# Patient Record
Sex: Female | Born: 1940 | Race: White | Hispanic: No | Marital: Married | Smoking: Current every day smoker
Health system: Southern US, Community
[De-identification: ages and names within clinical notes are randomized; demographics above are authoritative.]

## PROBLEM LIST (undated history)

## (undated) ENCOUNTER — Inpatient Hospital Stay: Admission: EM | Payer: Self-pay | Source: Home / Self Care

## (undated) DIAGNOSIS — N200 Calculus of kidney: Secondary | ICD-10-CM

## (undated) DIAGNOSIS — F32A Depression, unspecified: Secondary | ICD-10-CM

## (undated) DIAGNOSIS — R7303 Prediabetes: Secondary | ICD-10-CM

## (undated) DIAGNOSIS — F419 Anxiety disorder, unspecified: Secondary | ICD-10-CM

## (undated) DIAGNOSIS — M1990 Unspecified osteoarthritis, unspecified site: Secondary | ICD-10-CM

## (undated) DIAGNOSIS — R739 Hyperglycemia, unspecified: Secondary | ICD-10-CM

## (undated) DIAGNOSIS — Z720 Tobacco use: Secondary | ICD-10-CM

## (undated) DIAGNOSIS — F329 Major depressive disorder, single episode, unspecified: Secondary | ICD-10-CM

## (undated) DIAGNOSIS — R32 Unspecified urinary incontinence: Secondary | ICD-10-CM

## (undated) DIAGNOSIS — G479 Sleep disorder, unspecified: Secondary | ICD-10-CM

## (undated) DIAGNOSIS — I10 Essential (primary) hypertension: Secondary | ICD-10-CM

## (undated) DIAGNOSIS — R002 Palpitations: Secondary | ICD-10-CM

## (undated) DIAGNOSIS — L80 Vitiligo: Secondary | ICD-10-CM

## (undated) DIAGNOSIS — Z87442 Personal history of urinary calculi: Secondary | ICD-10-CM

## (undated) DIAGNOSIS — R011 Cardiac murmur, unspecified: Secondary | ICD-10-CM

## (undated) DIAGNOSIS — R82998 Other abnormal findings in urine: Secondary | ICD-10-CM

## (undated) DIAGNOSIS — M8580 Other specified disorders of bone density and structure, unspecified site: Secondary | ICD-10-CM

## (undated) DIAGNOSIS — E785 Hyperlipidemia, unspecified: Secondary | ICD-10-CM

## (undated) HISTORY — PX: TUBAL LIGATION: SHX77

## (undated) HISTORY — DX: Sleep disorder, unspecified: G47.9

## (undated) HISTORY — PX: OOPHORECTOMY: SHX86

## (undated) HISTORY — DX: Unspecified osteoarthritis, unspecified site: M19.90

## (undated) HISTORY — PX: CARPAL TUNNEL RELEASE: SHX101

## (undated) HISTORY — PX: BREAST BIOPSY: SHX20

## (undated) HISTORY — DX: Other abnormal findings in urine: R82.998

## (undated) HISTORY — DX: Hyperglycemia, unspecified: R73.9

## (undated) HISTORY — DX: Cardiac murmur, unspecified: R01.1

## (undated) HISTORY — DX: Depression, unspecified: F32.A

## (undated) HISTORY — DX: Other specified disorders of bone density and structure, unspecified site: M85.80

## (undated) HISTORY — DX: Tobacco use: Z72.0

## (undated) HISTORY — DX: Vitiligo: L80

## (undated) HISTORY — DX: Calculus of kidney: N20.0

## (undated) HISTORY — DX: Hyperlipidemia, unspecified: E78.5

## (undated) HISTORY — DX: Major depressive disorder, single episode, unspecified: F32.9

## (undated) HISTORY — PX: HEMORRHOID SURGERY: SHX153

---

## 1999-05-30 DIAGNOSIS — R002 Palpitations: Secondary | ICD-10-CM

## 1999-05-30 HISTORY — DX: Palpitations: R00.2

## 1999-05-31 ENCOUNTER — Other Ambulatory Visit: Admission: RE | Admit: 1999-05-31 | Discharge: 1999-05-31 | Payer: Self-pay | Admitting: Family Medicine

## 2001-01-09 ENCOUNTER — Other Ambulatory Visit: Admission: RE | Admit: 2001-01-09 | Discharge: 2001-01-09 | Payer: Self-pay | Admitting: Family Medicine

## 2001-05-29 HISTORY — PX: TRIGGER FINGER RELEASE: SHX641

## 2002-04-29 ENCOUNTER — Other Ambulatory Visit: Admission: RE | Admit: 2002-04-29 | Discharge: 2002-04-29 | Payer: Self-pay | Admitting: Family Medicine

## 2003-05-05 ENCOUNTER — Other Ambulatory Visit: Admission: RE | Admit: 2003-05-05 | Discharge: 2003-05-05 | Payer: Self-pay | Admitting: Family Medicine

## 2003-05-30 DIAGNOSIS — R82998 Other abnormal findings in urine: Secondary | ICD-10-CM

## 2003-05-30 HISTORY — DX: Other abnormal findings in urine: R82.998

## 2003-08-28 LAB — HM DEXA SCAN

## 2004-04-29 ENCOUNTER — Ambulatory Visit: Payer: Self-pay | Admitting: Family Medicine

## 2004-05-19 ENCOUNTER — Ambulatory Visit: Payer: Self-pay | Admitting: Family Medicine

## 2004-10-17 ENCOUNTER — Ambulatory Visit: Payer: Self-pay | Admitting: Family Medicine

## 2004-10-17 ENCOUNTER — Other Ambulatory Visit: Admission: RE | Admit: 2004-10-17 | Discharge: 2004-10-17 | Payer: Self-pay | Admitting: Family Medicine

## 2004-10-17 LAB — CONVERTED CEMR LAB: Pap Smear: NORMAL

## 2004-10-18 ENCOUNTER — Ambulatory Visit: Payer: Self-pay | Admitting: Family Medicine

## 2004-10-26 ENCOUNTER — Ambulatory Visit: Payer: Self-pay | Admitting: Family Medicine

## 2004-11-07 ENCOUNTER — Ambulatory Visit: Payer: Self-pay | Admitting: Family Medicine

## 2005-01-18 ENCOUNTER — Ambulatory Visit: Payer: Self-pay | Admitting: Family Medicine

## 2005-01-25 ENCOUNTER — Ambulatory Visit: Payer: Self-pay | Admitting: Family Medicine

## 2005-02-07 ENCOUNTER — Ambulatory Visit: Payer: Self-pay | Admitting: Oncology

## 2005-04-07 ENCOUNTER — Ambulatory Visit: Payer: Self-pay | Admitting: Family Medicine

## 2005-05-10 ENCOUNTER — Ambulatory Visit: Payer: Self-pay | Admitting: Family Medicine

## 2005-08-09 ENCOUNTER — Ambulatory Visit: Payer: Self-pay | Admitting: Family Medicine

## 2006-05-02 ENCOUNTER — Ambulatory Visit: Payer: Self-pay | Admitting: Family Medicine

## 2006-06-25 ENCOUNTER — Ambulatory Visit: Payer: Self-pay | Admitting: Family Medicine

## 2006-06-25 LAB — CONVERTED CEMR LAB
AST: 23 units/L (ref 0–37)
BUN: 11 mg/dL (ref 6–23)
CO2: 27 meq/L (ref 19–32)
Calcium: 8.8 mg/dL (ref 8.4–10.5)
Chloride: 102 meq/L (ref 96–112)
Cholesterol: 192 mg/dL (ref 0–200)
Creatinine, Ser: 0.7 mg/dL (ref 0.4–1.2)
Eosinophils Relative: 2.2 % (ref 0.0–5.0)
Glucose, Bld: 146 mg/dL — ABNORMAL HIGH (ref 70–99)
LDL Cholesterol: 107 mg/dL — ABNORMAL HIGH (ref 0–99)
MCHC: 33.9 g/dL (ref 30.0–36.0)
Monocytes Relative: 7.2 % (ref 3.0–11.0)
Neutrophils Relative %: 53.3 % (ref 43.0–77.0)
RBC: 4.55 M/uL (ref 3.87–5.11)
RDW: 13.3 % (ref 11.5–14.6)
Sodium: 138 meq/L (ref 135–145)
Total CHOL/HDL Ratio: 3.6
Triglycerides: 156 mg/dL — ABNORMAL HIGH (ref 0–149)
VLDL: 31 mg/dL (ref 0–40)

## 2006-07-12 ENCOUNTER — Ambulatory Visit: Payer: Self-pay | Admitting: Family Medicine

## 2006-08-07 ENCOUNTER — Ambulatory Visit: Payer: Self-pay | Admitting: Family Medicine

## 2006-08-07 LAB — CONVERTED CEMR LAB: Glucose, Bld: 110 mg/dL — ABNORMAL HIGH (ref 70–99)

## 2006-09-17 ENCOUNTER — Ambulatory Visit: Payer: Self-pay | Admitting: Family Medicine

## 2006-09-17 LAB — CONVERTED CEMR LAB
Hgb A1c MFr Bld: 5.8 %
Hgb A1c MFr Bld: 5.8 % (ref 4.6–6.0)

## 2006-11-19 ENCOUNTER — Telehealth (INDEPENDENT_AMBULATORY_CARE_PROVIDER_SITE_OTHER): Payer: Self-pay | Admitting: *Deleted

## 2006-11-27 LAB — HM COLONOSCOPY

## 2006-12-05 ENCOUNTER — Ambulatory Visit: Payer: Self-pay | Admitting: Unknown Physician Specialty

## 2007-01-14 ENCOUNTER — Ambulatory Visit: Payer: Self-pay | Admitting: Family Medicine

## 2007-01-14 DIAGNOSIS — Z789 Other specified health status: Secondary | ICD-10-CM

## 2007-01-14 DIAGNOSIS — N6019 Diffuse cystic mastopathy of unspecified breast: Secondary | ICD-10-CM

## 2007-01-14 DIAGNOSIS — R011 Cardiac murmur, unspecified: Secondary | ICD-10-CM | POA: Insufficient documentation

## 2007-01-14 DIAGNOSIS — L80 Vitiligo: Secondary | ICD-10-CM | POA: Insufficient documentation

## 2007-01-14 DIAGNOSIS — G4700 Insomnia, unspecified: Secondary | ICD-10-CM | POA: Insufficient documentation

## 2007-01-14 DIAGNOSIS — M8580 Other specified disorders of bone density and structure, unspecified site: Secondary | ICD-10-CM

## 2007-01-14 DIAGNOSIS — M1990 Unspecified osteoarthritis, unspecified site: Secondary | ICD-10-CM | POA: Insufficient documentation

## 2007-01-14 DIAGNOSIS — G479 Sleep disorder, unspecified: Secondary | ICD-10-CM | POA: Insufficient documentation

## 2007-01-14 LAB — CONVERTED CEMR LAB
Casts: 0 /lpf
Glucose, Urine, Semiquant: NEGATIVE
Specific Gravity, Urine: <1.005
Urine crystals, microscopic: 0 /hpf
Urobilinogen, UA: 0.2
Yeast, UA: 0
pH: 7

## 2007-01-15 ENCOUNTER — Encounter: Payer: Self-pay | Admitting: Family Medicine

## 2007-01-22 ENCOUNTER — Encounter: Payer: Self-pay | Admitting: Family Medicine

## 2007-01-22 LAB — CONVERTED CEMR LAB
Bilirubin Urine: NEGATIVE
Glucose, Urine, Semiquant: NEGATIVE
Nitrite: NEGATIVE
Protein, U semiquant: NEGATIVE
Urine crystals, microscopic: 0 /hpf
Yeast, UA: 0
pH: 6.5

## 2007-01-23 ENCOUNTER — Encounter: Payer: Self-pay | Admitting: Family Medicine

## 2007-02-20 ENCOUNTER — Telehealth: Payer: Self-pay | Admitting: Family Medicine

## 2007-02-20 ENCOUNTER — Ambulatory Visit: Payer: Self-pay | Admitting: Family Medicine

## 2007-02-20 LAB — CONVERTED CEMR LAB
Bilirubin Urine: NEGATIVE
Ketones, urine, test strip: NEGATIVE
Specific Gravity, Urine: <1.005
Urobilinogen, UA: 0.2
Yeast, UA: 0

## 2007-02-26 ENCOUNTER — Ambulatory Visit: Payer: Self-pay | Admitting: Cardiology

## 2007-02-26 DIAGNOSIS — N209 Urinary calculus, unspecified: Secondary | ICD-10-CM | POA: Insufficient documentation

## 2007-02-27 ENCOUNTER — Ambulatory Visit (HOSPITAL_BASED_OUTPATIENT_CLINIC_OR_DEPARTMENT_OTHER): Admission: RE | Admit: 2007-02-27 | Discharge: 2007-02-27 | Payer: Self-pay | Admitting: Urology

## 2007-02-27 ENCOUNTER — Encounter: Payer: Self-pay | Admitting: Family Medicine

## 2007-04-10 ENCOUNTER — Telehealth: Payer: Self-pay | Admitting: Family Medicine

## 2007-07-12 ENCOUNTER — Encounter: Payer: Self-pay | Admitting: Family Medicine

## 2007-10-01 ENCOUNTER — Other Ambulatory Visit: Admission: RE | Admit: 2007-10-01 | Discharge: 2007-10-01 | Payer: Self-pay | Admitting: Family Medicine

## 2007-10-01 ENCOUNTER — Encounter: Payer: Self-pay | Admitting: Family Medicine

## 2007-10-01 ENCOUNTER — Ambulatory Visit: Payer: Self-pay | Admitting: Family Medicine

## 2007-10-01 DIAGNOSIS — R739 Hyperglycemia, unspecified: Secondary | ICD-10-CM | POA: Insufficient documentation

## 2007-10-01 DIAGNOSIS — K219 Gastro-esophageal reflux disease without esophagitis: Secondary | ICD-10-CM

## 2007-10-01 DIAGNOSIS — F329 Major depressive disorder, single episode, unspecified: Secondary | ICD-10-CM

## 2007-10-01 DIAGNOSIS — E785 Hyperlipidemia, unspecified: Secondary | ICD-10-CM | POA: Insufficient documentation

## 2007-10-01 LAB — CONVERTED CEMR LAB
Bacteria, UA: 0
Casts: 0 /lpf
Glucose, Urine, Semiquant: NEGATIVE
Mucus, UA: 0
Urine crystals, microscopic: 0 /hpf
Urobilinogen, UA: 0.2
WBC, UA: 1 cells/hpf
pH: 6.5

## 2007-10-04 ENCOUNTER — Encounter (INDEPENDENT_AMBULATORY_CARE_PROVIDER_SITE_OTHER): Payer: Self-pay | Admitting: *Deleted

## 2007-10-07 ENCOUNTER — Encounter (INDEPENDENT_AMBULATORY_CARE_PROVIDER_SITE_OTHER): Payer: Self-pay | Admitting: *Deleted

## 2007-10-07 LAB — CONVERTED CEMR LAB
ALT: 17 units/L (ref 0–35)
Alkaline Phosphatase: 60 units/L (ref 39–117)
BUN: 12 mg/dL (ref 6–23)
Basophils Relative: 0.4 % (ref 0.0–1.0)
Calcium: 9.4 mg/dL (ref 8.4–10.5)
Chloride: 108 meq/L (ref 96–112)
Cholesterol: 199 mg/dL (ref 0–200)
Eosinophils Absolute: 0.1 10*3/uL (ref 0.0–0.7)
HCT: 42.8 % (ref 36.0–46.0)
HDL: 54.7 mg/dL (ref >39.0)
Hgb A1c MFr Bld: 5.9 % (ref 4.6–6.0)
MCV: 94.6 fL (ref 78.0–100.0)
Phosphorus: 3.6 mg/dL (ref 2.3–4.6)
Platelets: 276 10*3/uL (ref 150–400)
RDW: 13.3 % (ref 11.5–14.6)
Total Bilirubin: 0.9 mg/dL (ref 0.3–1.2)
Total Protein: 7 g/dL (ref 6.0–8.3)
Triglycerides: 129 mg/dL (ref 0–149)

## 2007-10-23 ENCOUNTER — Encounter: Payer: Self-pay | Admitting: Family Medicine

## 2007-10-23 ENCOUNTER — Ambulatory Visit: Payer: Self-pay | Admitting: Family Medicine

## 2007-10-25 ENCOUNTER — Encounter (INDEPENDENT_AMBULATORY_CARE_PROVIDER_SITE_OTHER): Payer: Self-pay | Admitting: *Deleted

## 2007-10-29 ENCOUNTER — Encounter: Payer: Self-pay | Admitting: Family Medicine

## 2007-11-12 ENCOUNTER — Ambulatory Visit: Payer: Self-pay | Admitting: Family Medicine

## 2007-11-12 DIAGNOSIS — E559 Vitamin D deficiency, unspecified: Secondary | ICD-10-CM

## 2007-12-25 ENCOUNTER — Ambulatory Visit: Payer: Self-pay | Admitting: Family Medicine

## 2007-12-27 ENCOUNTER — Encounter (INDEPENDENT_AMBULATORY_CARE_PROVIDER_SITE_OTHER): Payer: Self-pay | Admitting: *Deleted

## 2007-12-27 LAB — CONVERTED CEMR LAB: Vit D, 1,25-Dihydroxy: 35 (ref 30–89)

## 2007-12-31 ENCOUNTER — Telehealth: Payer: Self-pay | Admitting: Family Medicine

## 2008-01-08 ENCOUNTER — Telehealth: Payer: Self-pay | Admitting: Family Medicine

## 2008-01-10 ENCOUNTER — Telehealth (INDEPENDENT_AMBULATORY_CARE_PROVIDER_SITE_OTHER): Payer: Self-pay | Admitting: *Deleted

## 2008-03-06 ENCOUNTER — Ambulatory Visit: Payer: Self-pay | Admitting: Family Medicine

## 2008-04-09 ENCOUNTER — Telehealth: Payer: Self-pay | Admitting: Family Medicine

## 2008-05-11 ENCOUNTER — Telehealth: Payer: Self-pay | Admitting: Family Medicine

## 2008-06-29 HISTORY — PX: HAND SURGERY: SHX662

## 2008-09-04 ENCOUNTER — Ambulatory Visit (HOSPITAL_COMMUNITY): Admission: RE | Admit: 2008-09-04 | Discharge: 2008-09-04 | Payer: Self-pay | Admitting: Urology

## 2008-09-21 ENCOUNTER — Ambulatory Visit (HOSPITAL_COMMUNITY): Admission: RE | Admit: 2008-09-21 | Discharge: 2008-09-21 | Payer: Self-pay | Admitting: Urology

## 2008-10-23 ENCOUNTER — Telehealth: Payer: Self-pay | Admitting: Family Medicine

## 2009-01-05 ENCOUNTER — Telehealth: Payer: Self-pay | Admitting: Family Medicine

## 2009-01-11 ENCOUNTER — Encounter: Payer: Self-pay | Admitting: Family Medicine

## 2009-01-11 ENCOUNTER — Ambulatory Visit: Payer: Self-pay | Admitting: Family Medicine

## 2009-01-18 ENCOUNTER — Encounter (INDEPENDENT_AMBULATORY_CARE_PROVIDER_SITE_OTHER): Payer: Self-pay | Admitting: *Deleted

## 2009-02-09 ENCOUNTER — Encounter: Payer: Self-pay | Admitting: Family Medicine

## 2009-02-09 ENCOUNTER — Other Ambulatory Visit: Admission: RE | Admit: 2009-02-09 | Discharge: 2009-02-09 | Payer: Self-pay | Admitting: Family Medicine

## 2009-02-09 ENCOUNTER — Ambulatory Visit: Payer: Self-pay | Admitting: Family Medicine

## 2009-02-09 LAB — CONVERTED CEMR LAB
KOH Prep: NEGATIVE
Whiff Test: NEGATIVE

## 2009-02-10 LAB — CONVERTED CEMR LAB
ALT: 15 units/L (ref 0–35)
Basophils Relative: 0.8 % (ref 0.0–3.0)
Chloride: 108 meq/L (ref 96–112)
Cholesterol: 203 mg/dL — ABNORMAL HIGH (ref 0–200)
Creatinine, Ser: 0.6 mg/dL (ref 0.4–1.2)
Eosinophils Absolute: 0.1 10*3/uL (ref 0.0–0.7)
Glucose, Bld: 80 mg/dL (ref 70–99)
HCT: 42.8 % (ref 36.0–46.0)
HDL: 58.3 mg/dL (ref >39.00)
Hemoglobin: 14.5 g/dL (ref 12.0–15.0)
Lymphs Abs: 1.9 10*3/uL (ref 0.7–4.0)
MCV: 97.2 fL (ref 78.0–100.0)
Monocytes Absolute: 0.6 10*3/uL (ref 0.1–1.0)
Neutro Abs: 3.8 10*3/uL (ref 1.4–7.7)
Platelets: 246 10*3/uL (ref 150.0–400.0)
Potassium: 4.5 meq/L (ref 3.5–5.1)
RBC: 4.41 M/uL (ref 3.87–5.11)
TSH: 2.35 microintl units/mL (ref 0.35–5.50)
WBC: 6.5 10*3/uL (ref 4.5–10.5)

## 2009-02-11 LAB — CONVERTED CEMR LAB: Vit D, 25-Hydroxy: 18 ng/mL — ABNORMAL LOW (ref 30–89)

## 2009-02-15 ENCOUNTER — Encounter (INDEPENDENT_AMBULATORY_CARE_PROVIDER_SITE_OTHER): Payer: Self-pay | Admitting: *Deleted

## 2009-03-03 ENCOUNTER — Encounter (INDEPENDENT_AMBULATORY_CARE_PROVIDER_SITE_OTHER): Payer: Self-pay | Admitting: Internal Medicine

## 2009-03-03 ENCOUNTER — Ambulatory Visit: Payer: Self-pay | Admitting: Family Medicine

## 2009-03-03 DIAGNOSIS — M546 Pain in thoracic spine: Secondary | ICD-10-CM | POA: Insufficient documentation

## 2009-03-26 ENCOUNTER — Encounter (INDEPENDENT_AMBULATORY_CARE_PROVIDER_SITE_OTHER): Payer: Self-pay | Admitting: *Deleted

## 2009-04-26 ENCOUNTER — Telehealth: Payer: Self-pay | Admitting: Family Medicine

## 2009-06-21 ENCOUNTER — Telehealth: Payer: Self-pay | Admitting: Family Medicine

## 2009-06-22 ENCOUNTER — Ambulatory Visit: Payer: Self-pay | Admitting: Family Medicine

## 2009-06-22 DIAGNOSIS — M2550 Pain in unspecified joint: Secondary | ICD-10-CM | POA: Insufficient documentation

## 2009-06-22 DIAGNOSIS — IMO0001 Reserved for inherently not codable concepts without codable children: Secondary | ICD-10-CM

## 2009-06-25 LAB — CONVERTED CEMR LAB
BUN: 14 mg/dL (ref 6–23)
Basophils Relative: 0.4 % (ref 0.0–3.0)
Chloride: 104 meq/L (ref 96–112)
Eosinophils Absolute: 0.1 10*3/uL (ref 0.0–0.7)
Eosinophils Relative: 1.4 % (ref 0.0–5.0)
GFR calc non Af Amer: 66.19 mL/min (ref >60)
Glucose, Bld: 137 mg/dL — ABNORMAL HIGH (ref 70–99)
Hemoglobin: 14.5 g/dL (ref 12.0–15.0)
Monocytes Absolute: 0.6 10*3/uL (ref 0.1–1.0)
Monocytes Relative: 8 % (ref 3.0–12.0)
Neutro Abs: 5.3 10*3/uL (ref 1.4–7.7)
Neutrophils Relative %: 67.1 % (ref 43.0–77.0)
RBC: 4.42 M/uL (ref 3.87–5.11)
Sodium: 144 meq/L (ref 135–145)

## 2009-06-29 HISTORY — PX: BACK SURGERY: SHX140

## 2009-07-06 ENCOUNTER — Ambulatory Visit: Payer: Self-pay

## 2009-07-14 ENCOUNTER — Encounter: Payer: Self-pay | Admitting: Family Medicine

## 2009-07-22 ENCOUNTER — Inpatient Hospital Stay (HOSPITAL_COMMUNITY): Admission: RE | Admit: 2009-07-22 | Discharge: 2009-07-24 | Payer: Self-pay | Admitting: Neurosurgery

## 2009-08-17 ENCOUNTER — Encounter: Payer: Self-pay | Admitting: Family Medicine

## 2009-08-23 ENCOUNTER — Ambulatory Visit: Payer: Self-pay | Admitting: Family Medicine

## 2009-08-23 DIAGNOSIS — N83209 Unspecified ovarian cyst, unspecified side: Secondary | ICD-10-CM | POA: Insufficient documentation

## 2009-08-23 DIAGNOSIS — N760 Acute vaginitis: Secondary | ICD-10-CM | POA: Insufficient documentation

## 2009-08-23 LAB — CONVERTED CEMR LAB: KOH Prep: NEGATIVE

## 2009-08-25 ENCOUNTER — Encounter: Payer: Self-pay | Admitting: Family Medicine

## 2009-09-22 ENCOUNTER — Encounter: Payer: Self-pay | Admitting: Family Medicine

## 2009-09-28 ENCOUNTER — Encounter: Payer: Self-pay | Admitting: Family Medicine

## 2009-10-21 ENCOUNTER — Ambulatory Visit (HOSPITAL_COMMUNITY): Admission: RE | Admit: 2009-10-21 | Discharge: 2009-10-21 | Payer: Self-pay | Admitting: Obstetrics and Gynecology

## 2009-10-21 ENCOUNTER — Encounter: Payer: Self-pay | Admitting: Family Medicine

## 2009-10-26 ENCOUNTER — Ambulatory Visit: Payer: Self-pay | Admitting: Family Medicine

## 2009-10-26 LAB — CONVERTED CEMR LAB: Hgb A1c MFr Bld: 6.3 % (ref 4.6–6.5)

## 2009-11-01 ENCOUNTER — Encounter: Payer: Self-pay | Admitting: Family Medicine

## 2009-11-04 ENCOUNTER — Ambulatory Visit: Payer: Self-pay | Admitting: Family Medicine

## 2009-11-05 ENCOUNTER — Telehealth: Payer: Self-pay | Admitting: Family Medicine

## 2009-11-12 ENCOUNTER — Encounter: Payer: Self-pay | Admitting: Family Medicine

## 2009-12-10 ENCOUNTER — Ambulatory Visit (HOSPITAL_COMMUNITY): Admission: RE | Admit: 2009-12-10 | Discharge: 2009-12-10 | Payer: Self-pay | Admitting: Urology

## 2009-12-30 ENCOUNTER — Ambulatory Visit: Payer: Self-pay | Admitting: Family Medicine

## 2009-12-30 DIAGNOSIS — K644 Residual hemorrhoidal skin tags: Secondary | ICD-10-CM | POA: Insufficient documentation

## 2009-12-30 LAB — CONVERTED CEMR LAB
KOH Prep: NEGATIVE
Whiff Test: POSITIVE

## 2010-04-04 ENCOUNTER — Encounter: Payer: Self-pay | Admitting: Family Medicine

## 2010-05-18 ENCOUNTER — Telehealth: Payer: Self-pay | Admitting: Family Medicine

## 2010-06-28 NOTE — Progress Notes (Signed)
Summary: Pain in legs severe at times  Phone Note Call from Patient Call back at [phone]   Caller: Patient Call For: Judith Part MD Summary of Call: Pt has been hurting from pelvic area down to both knees for one week. Pt said is a dull ache but when sits down the pain in sharp in the back of both legs. pain scale of 1 -10 pain now is 5 but took pain pill one hr ago. First thing in the morning pain is severe  and has trouble walking she hurts so bad. Had kidney stone two weeks ago. Pain is not in the back. Pt wonders if could be blood clot?  Pt has been taking Percocet.Pt uses [facility] pharmacy if needed.  Pt would like callback ASAP.Please advise.  Initial call taken by: Lewanda Rife LPN,  June 21, 2009 10:16 AM  Follow-up for Phone Call        please schedule appt to be seen /first avail -- need to check it out / do not know what it could be Follow-up by: Judith Part MD,  June 21, 2009 11:07 AM  Additional Follow-up for Phone Call Additional follow up Details #1::        Appt made for tomorrow. Additional Follow-up by: Lowella Petties CMA,  June 21, 2009 12:15 PM

## 2010-06-28 NOTE — Assessment & Plan Note (Signed)
Summary: 3 MONTH FOLLOW UP/RBHR/S FROM 6/7   Vital Signs:  Patient profile:   70 year old female Height:      60.5 inches Weight:      164.25 pounds BMI:     31.66 Temp:     98.5 degrees F oral Pulse rate:   84 / minute Pulse rhythm:   regular BP sitting:   134 / 80  (left arm) Cuff size:   regular  Vitals Entered By: Lewanda Rife LPN (November 04, 1608 9:21 AM) CC: three month f/u   History of Present Illness: here for f/u of hyperglycemia  wt is stable  bp 134/80   AIC 6.3- up from 6.0 has not been able to exercise with medical problem diet -- is not optimal  is avoiding sugars - as much as she can  some fruit , is a big carb eater -- did change to whole wheat bread and pasta   had surg on R ov mass  went for her re check -- benign mass feels better now  was tx for vaginitis again -- this past monday - still some itch and burning  has not call back   will see urol for cyst on her kidney - no symptoms from that   back is doing better - still taking relafen for now  f/u on 6 /17  stomach is ok   turned in car yesterday -- a really sore arm after twisting and lifting  no ice yet  did take a pain pill  is frustrated with pain   Allergies: 1)  ! Fosamax 2)  ! Wellbutrin  Past History:  Past Medical History: Last updated: 2009/02/12 Osteopenia sleep disorder kidney stones - with lithotripsy and stent  hyperlipidemia  hyperglycemia  depression vitiligo heart M  deg joint dz tabacco abuse   urology   Past Surgical History: Last updated: 08/23/2009 Carpal tunnel release- bilateral Hemorrhoidectomy Oophorectomy- left, cyst Breast biopsies x 2, left, benign Right hand- trigger finger surgery (2003) Treadmill stress- neg (09/1998) Kidney stones Colonoscopy/ EGD (03/1999) Dexa- mild osteopenia (05/1999), worse (08/2003) Colonoscopy- polyps, diverticulosis (03/2001) Urology work- up for [facility] in urine- normal (2005) Genetic work-up  (01/2005) Colonoscopy- polyps, tics, hem (11/2006) MRI back at [facility] (06/1997) Breast exam and pap at [facility] (06/1997) hand surgery 2/10 kidney stones with lithotripsy and stents  back surgery 2/11 - fusion   Family History: Last updated: 2009-02-12 Father: deceased age 4, MI Mother: deceased age 84, brain tumor Siblings: 3 brothers with DM, 1 with colon cancer and CAD.  1 sister deceased from uterine cancer age 59 brother with prostate ca  Social History: Last updated: 12-Feb-2009 Marital Status: Married Children: 2, daughter with HTN Occupation:  smoker -- quit in 9/09 (off and on)   Risk Factors: Smoking Status: current (01/14/2007)  Review of Systems General:  Complains of fatigue; denies loss of appetite and malaise. Eyes:  Denies blurring and eye irritation. CV:  Denies chest pain or discomfort, lightheadness, and palpitations. Resp:  Denies cough, shortness of breath, and wheezing. GU:  Complains of discharge; denies abnormal vaginal bleeding. MS:  Complains of muscle aches; denies joint redness and joint swelling. Derm:  Denies itching, lesion(s), poor wound healing, and rash. Neuro:  Denies numbness and tingling. Psych:  Denies anxiety and depression. Endo:  Denies excessive thirst and excessive urination. Heme:  Denies abnormal bruising and bleeding.  Physical Exam  General:  overweight but generally well appearing  Head:  normocephalic, atraumatic, and  no abnormalities observed.   Eyes:  vision grossly intact, pupils equal, pupils round, and pupils reactive to light.   Mouth:  pharynx pink and moist.   Neck:  supple with full rom and no masses or thyromegally, no JVD or carotid bruit  Chest Wall:  No deformities, masses, or tenderness noted. Lungs:  Normal respiratory effort, chest expands symmetrically. Lungs are clear to auscultation, no crackles or wheezes. Heart:  Normal rate and regular rhythm. S1 and S2 normal without gallop, murmur, click, rub or other  extra sounds. Abdomen:  Bowel sounds positive,abdomen soft and non-tender without masses, organomegaly or hernias noted.  no suprapubic tenderness or fullness felt  no renal bruits  Msk:  No deformity or scoliosis noted of thoracic or lumbar spine.  no acute joint changes  Pulses:  R and L carotid,radial,femoral,dorsalis pedis and posterior tibial pulses are full and equal bilaterally Extremities:  No clubbing, cyanosis, edema, or deformity noted with normal full range of motion of all joints.   Neurologic:  sensation intact to light touch, gait normal, and DTRs symmetrical and normal.   Skin:  Intact without suspicious lesions or rashes Cervical Nodes:  No lymphadenopathy noted Inguinal Nodes:  No significant adenopathy Psych:  seems generally down/ negative and irritable   Impression & Recommendations:  Problem # 1:  VAGINITIS (ICD-616.10) Assessment Unchanged pt will call gyn to see what wet prep showed- if neg enc f/u for that -- / atrophic change?  The following medications were removed from the medication list:    Metrogel-vaginal 0.75 % Gel (Metronidazole) .Marland Kitchen... 1 applicator intravaginally at bedtime for 7 days  Problem # 2:  HYPERGLYCEMIA (ICD-790.29) Assessment: Unchanged  rev labs rev diet for hyperglycemia in detail  enc to get back to exercise when cleared to do so   Labs Reviewed: Creat: 0.9 (06/22/2009)     Problem # 3:  DEPRESSION (ICD-311) Assessment: Deteriorated I think depression makes her feel bad physically as well  would benefit from re starting zoloft - pt agrees counseling offered/ pt declined for now  Her updated medication list for this problem includes:    Zoloft 50 Mg Tabs (Sertraline hcl) .Marland Kitchen... 1 by mouth each am  Complete Medication List: 1)  Lunesta 3 Mg Tabs (Eszopiclone) .... One by mouth at bedtime as needed 2)  Zoloft 50 Mg Tabs (Sertraline hcl) .Marland Kitchen.. 1 by mouth each am 3)  Oxycodone-acetaminophen 5-325 Mg Tabs (Oxycodone-acetaminophen) ....  Take one tablet three  times a day as needed for pain 4)  Ibuprofen 200 Mg Tabs (Ibuprofen) .... Otc as directed. 5)  Vitamin D 1000 Unit Tabs (Cholecalciferol) .... Take one daily 6)  Nubetone 500mg   .... Take two daily  Patient Instructions: 1)  continue to avoid simple sugars in diet and sweet drinks  2)  cut portions of carbohydrates - like pasta and bread and fruit - to 2-3 ounces maximum 3)  eat more green veggies and lean protien  4)  get back to exercise when you are able  5)  schedule fasting lab and then f/u in 6 months 6)  lipid/ast/alt/renal/ AIC 272, hyperglycemia  7)  start back on zoloft if depression returns   Current Allergies (reviewed today): ! FOSAMAX ! WELLBUTRIN

## 2010-06-28 NOTE — Letter (Signed)
Summary: [facility]  [facility]   Imported By: Lanelle Bal 07/23/2009 09:18:10  _____________________________________________________________________  External Attachment:    Type:   Image     Comment:   External Document

## 2010-06-28 NOTE — Letter (Signed)
Summary: [facility]  [facility]   Imported By: Lanelle Bal 10/22/2009 12:16:40  _____________________________________________________________________  External Attachment:    Type:   Image     Comment:   External Document

## 2010-06-28 NOTE — Progress Notes (Signed)
Summary: refill request for lunesta  Phone Note Refill Request Message from:  Fax from Pharmacy  Refills Requested: Medication #1:  LUNESTA 3 MG  TABS one by mouth at bedtime as needed   Last Refilled: 10/07/2009 Faxed request from [facility], phone [phone].  Initial call taken by: Lowella Petties CMA,  November 05, 2009 3:28 PM  Follow-up for Phone Call        can have #30 with 5 ref   Follow-up by: Judith Part MD,  November 05, 2009 3:58 PM  Additional Follow-up for Phone Call Additional follow up Details #1::        Medication phoned to [facility] pharmacy as instructed. Lewanda Rife LPN  November 05, 2009 5:28 PM

## 2010-06-28 NOTE — Miscellaneous (Signed)
Summary: Flu vaccine  Clinical Lists Changes  Observations: Added new observation of FLU VAX: Historical (04/02/2010 16:30)      Influenza Immunization History:    Influenza # 1:  Historical (04/02/2010) Received form from [facility], [address], [place], [place]

## 2010-06-28 NOTE — Consult Note (Signed)
Summary: [facility]   Imported By: Lanelle Bal 10/07/2009 07:51:52  _____________________________________________________________________  External Attachment:    Type:   Image     Comment:   External Document

## 2010-06-28 NOTE — Assessment & Plan Note (Signed)
Summary: Leg pain   Vital Signs:  Patient profile:   70 year old female Weight:      163 pounds Temp:     97.9 degrees F oral Pulse rate:   80 / minute Pulse rhythm:   regular BP sitting:   142 / 74  (left arm) Cuff size:   regular  Vitals Entered By: Lowella Petties CMA (June 22, 2009 2:02 PM) CC: Both legs hurting x one week, from groin down to knees.   History of Present Illness: bad leg pain from pelvis down to her knees-- for about a week  a constant ache and worse if up too much -- sharp pains - really severe  not swollen or red / but are slt tender to the touch  no joint swelling  ? if any rash  can barely walk to the kitchen in the am  does not feel like a kidney stone  no other pain - upper body is fine  the R leg is a little worse than the other   had bursitis in shoulder - was on nsaid -- started with a ?   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 Fosamax 2)  ! Wellbutrin  Past History:  Past Medical History: Last updated: 10-Mar-2009 Osteopenia sleep disorder kidney stones - with lithotripsy and stent  hyperlipidemia  hyperglycemia  depression vitiligo heart M  deg joint dz tabacco abuse   urology   Past Surgical History: Last updated: 03/10/2009 Carpal tunnel release- bilateral Hemorrhoidectomy Oophorectomy- left, cyst Breast biopsies x 2, left, benign Right hand- trigger finger surgery (2003) Treadmill stress- neg (09/1998) Kidney stones Colonoscopy/ EGD (03/1999) Dexa-  mild osteopenia (05/1999), worse (08/2003) Colonoscopy- polyps, diverticulosis (03/2001) Urology work- up for [facility] in urine- normal (2005) Genetic work-up (01/2005) Colonoscopy- polyps, tics, hem (11/2006) MRI back at [facility] (06/1997) Breast exam and pap at [facility] (06/1997) hand surgery 2/10 kidney stones with lithotripsy and stents   Family History: Last updated: Mar 10, 2009 Father: deceased age 37, MI Mother: deceased age 59, brain tumor Siblings: 3 brothers with DM, 1 with colon cancer and CAD.  1 sister deceased from uterine cancer age 94 brother with prostate ca  Social History: Last updated: 03/10/09 Marital Status: Married Children: 2, daughter with HTN Occupation:  smoker -- quit in 9/09 (off and on)   Risk Factors: Smoking Status: current (01/14/2007)  Review of Systems General:  Complains of fatigue; denies chills, fever, loss of appetite, and malaise. Eyes:  Denies blurring. CV:  Denies chest pain or discomfort and palpitations. Resp:  Denies cough and wheezing. GI:  Denies abdominal pain, change in bowel habits, and indigestion. GU:  Denies abnormal vaginal bleeding. MS:  Complains of joint pain, muscle aches, and stiffness; denies joint redness, joint swelling, cramps, and muscle weakness. Derm:  Denies itching, lesion(s), poor wound healing, and rash. Neuro:  Denies poor balance, tingling, and weakness.  Psych:  mood is ok . Endo:  Denies cold intolerance, excessive thirst, excessive urination, and heat intolerance. Heme:  Denies abnormal bruising, bleeding, enlarge lymph nodes, and fevers.  Physical Exam  General:  alert, well-developed, well-nourished, and well-hydrated.  NAD Head:  normocephalic, atraumatic, and no abnormalities observed.   Mouth:  pharynx pink and moist.   Neck:  supple with full rom and no masses or thyromegally, no JVD or carotid bruit  nl rom , no tenderness  Chest Wall:  No deformities, masses, or tenderness noted. Lungs:  moist  harsh distant cough, wheeze at end of expiration that moved with cough. no wheezes.   Heart:  slt systolic M RRR Abdomen:  soft, non-tender, and normal bowel sounds.   Msk:  nl rom bilat LE with minimal soft tissue or bony tenderness no trochanteric tenderness  nl rom knees and hips with no addn pain  no LS tenderness  Pulses:  R and L carotid,radial,femoral,dorsalis pedis and posterior tibial pulses are full and equal bilaterally Extremities:  No clubbing, cyanosis, edema, or deformity noted with normal full range of motion of all joints.   Neurologic:  strength normal in all extremities, sensation intact to light touch, gait normal, and DTRs symmetrical and normal.   Skin:  Intact without suspicious lesions or rashes no ecchymosis  Cervical Nodes:  No lymphadenopathy noted Inguinal Nodes:  No significant adenopathy Psych:  nl affect    Impression & Recommendations:  Problem # 1:  MYALGIA (ICD-729.1) Assessment New myalgia/ arthralgia-- severe leg pain of ? etiol in review of hx - she has been recently on levaquin and steroid shot (? if tendon inflammation from that) lab today ref orthoasap trial of mobic - update  The following medications were removed from the medication list:    Motrin Ib 200 Mg Tabs (Ibuprofen) .Marland KitchenMarland KitchenMarland KitchenMarland Kitchen 3 tabs by mouth once daily as needed Her updated medication list for this problem includes:    Mobic 15 Mg Tabs (Meloxicam) .Marland Kitchen... 1 by mouth once daily with food for leg pain  Orders: Venipuncture (09811) TLB-BMP (Basic Metabolic Panel-BMET) (80048-METABOL) TLB-CBC Platelet - w/Differential (85025-CBCD) TLB-CK Total Only(Creatine Kinase/CPK) (82550-CK) TLB-Sedimentation Rate (ESR) (85652-ESR) Orthopedic Referral (Ortho)  Complete Medication List: 1)  Lunesta 3 Mg Tabs (Eszopiclone) .... One by mouth at bedtime as needed 2)  Zoloft 50 Mg Tabs (Sertraline hcl) .Marland Kitchen.. 1 by mouth each am 3)  Percocet 7.5- Pt Doesnt Know Tylenol Dose  .... Take one by mouth 2-3  times a day 4)  Mobic 15 Mg Tabs (Meloxicam) .Marland Kitchen.. 1 by mouth once daily with food for leg pain  Patient Instructions: 1)  we will do referral to orthopedics at check out  2)  use the mobic for pain with food once daily  3)  use heat on legs if it helps 4)  labs today , I will update you when these come back  5)  for sugar - avoid sweets and sugar drinks and juices  6)  keep carbohydrate portions small -- bread / pasta/ rice / potato  7)  always choose whole wheat options when able  8)  schedule lab in 3 mo AIC for hyperglycemia and then follow up  Prescriptions: MOBIC 15 MG TABS (MELOXICAM) 1 by mouth once daily with food for leg pain  #30 x 1   Entered and Authorized by:   Judith Part MD   Signed by:   Judith Part MD on 06/22/2009   Method used:   Print  then Give to Patient   RxID:   [id number]   Prior Medications (reviewed today): LUNESTA 3 MG  TABS (ESZOPICLONE) one by mouth at bedtime as needed ZOLOFT 50 MG  TABS (SERTRALINE HCL) 1 by mouth each am Current Allergies: ! FOSAMAX ! WELLBUTRIN

## 2010-06-28 NOTE — Consult Note (Signed)
Summary: [facility]    Imported By: Lanelle Bal 10/07/2009 07:54:00  _____________________________________________________________________  External Attachment:    Type:   Image     Comment:   External Document

## 2010-06-28 NOTE — Op Note (Signed)
Summary: [facility]orect[facility]   Imported By: Lanelle Bal 11/10/2009 10:00:10  _____________________________________________________________________  External Attachment:    Type:   Image     Comment:   External Document

## 2010-06-28 NOTE — Assessment & Plan Note (Signed)
Summary: yeast infection/per dr. Alysson Geist/ [facility]   Vital Signs:  Patient profile:   70 year old female Height:      60.5 inches Weight:      163.75 pounds BMI:     31.57 Temp:     98.9 degrees F oral Pulse rate:   96 / minute Pulse rhythm:   regular BP sitting:   146 / 82  (left arm) Cuff size:   regular  Vitals Entered By: Lewanda Rife LPN (August 23, 2009 2:20 PM) CC: ?yeast infection, vaginal itching inside and outside   History of Present Illness: had her back fusion surgery 4 weeks ago -- is better but not totally  went to urologist this am -- for f/u of kidney stones  found a cyst on her kidney- had an ultrasound on that  also cyst on her R ovary (other one was removed)  needs a good gyn -- for ref of this   now she thinks she has a yeast infx- itching and burning  3 day monistat did not help much - inside and outside symptoms discharge is white and odor to it   no pelvic pain at all  has had bact infection in the past  had a little bact in urine this am-- but no urinary symptoms at all   Allergies: 1)  ! Fosamax 2)  ! Wellbutrin  Past History:  Past Medical History: Last updated: 02-23-2009 Osteopenia sleep disorder kidney stones - with lithotripsy and stent  hyperlipidemia  hyperglycemia  depression vitiligo heart M  deg joint dz tabacco abuse   urology   Family History: Last updated: 2009/02/23 Father: deceased age 76, MI Mother: deceased age 32, brain tumor Siblings: 3 brothers with DM, 1 with colon cancer and CAD.  1 sister deceased from uterine cancer age 32 brother with prostate ca  Social History: Last updated: Feb 23, 2009 Marital Status: Married Children: 2, daughter with HTN Occupation:  smoker -- quit in 9/09 (off and on)   Risk Factors: Smoking Status: current (01/14/2007)  Past Surgical History: Carpal tunnel release- bilateral Hemorrhoidectomy Oophorectomy- left, cyst Breast biopsies x 2, left, benign Right hand- trigger  finger surgery (2003) Treadmill stress- neg (09/1998) Kidney stones Colonoscopy/ EGD (03/1999) Dexa- mild osteopenia (05/1999), worse (08/2003) Colonoscopy- polyps, diverticulosis (03/2001) Urology work- up for [facility] in urine- normal (2005) Genetic work-up (01/2005) Colonoscopy- polyps, tics, hem (11/2006) MRI back at [facility] (06/1997) Breast exam and [facility] at [facility] (06/1997) hand surgery 2/10 kidney stones with lithotripsy and stents  back surgery 2/11 - fusion   Review of Systems General:  Denies fatigue, loss of appetite, and malaise. Eyes:  Denies blurring. CV:  Denies chest pain or discomfort and palpitations. Resp:  Denies cough and wheezing. GI:  Denies abdominal pain, bloody stools, change in bowel habits, indigestion, nausea, and vomiting. GU:  Complains of discharge; denies dysuria and incontinence. MS:  Denies joint pain. Derm:  Complains of itching; denies poor wound healing and rash. Neuro:  Denies numbness and tingling. Endo:  Denies cold intolerance, excessive thirst, excessive urination, and heat intolerance. Heme:  Denies abnormal bruising and bleeding.  Physical Exam  General:  Well-developed,well-nourished,in no acute distress; alert,appropriate and cooperative throughout examination Head:  normocephalic, atraumatic, and no abnormalities observed.   Mouth:  pharynx pink and moist.   Neck:  No deformities, masses, or tenderness noted. Lungs:  Normal respiratory effort, chest expands symmetrically. Lungs are clear to auscultation, no crackles or wheezes. Heart:  Normal rate and regular rhythm. S1  and S2 normal without gallop, murmur, click, rub or other extra sounds. Abdomen:  no suprapubic tenderness or fullness felt  Genitalia:  normal introitus, no external lesions, no vaginal discharge, and mucosa pink and moist.   nl tenderness or odor  wet prep obt Msk:  no CVA tenderness  Skin:  Intact without suspicious lesions or rashes Cervical Nodes:  No lymphadenopathy  noted Inguinal Nodes:  No significant adenopathy Psych:  normal affect, talkative and pleasant    Impression & Recommendations:  Problem # 1:  VAGINITIS (ICD-616.10) Assessment New  s/p tx with otc monistat symptoms consistent with yeast but clue cells on wet prep will double cover for yeast and BV  diflucan 150 mg times one by mouth metrogel vaginal for 7 d update if not imp also enc yogurt for probiotics - one serving per day Her updated medication list for this problem includes:    Metrogel-vaginal 0.75 % Gel (Metronidazole) .Marland Kitchen... 1 applicator intravaginally at bedtime for 7 days  Orders: Wet Prep (32440NU) Prescription Created Electronically [phone])  Problem # 2:  OVARIAN CYST (ICD-620.2) Assessment: New new ovarian cyst on R remaining ovary found incidentally on renal US and asympt ref to gyn Orders: Gynecologic Referral (Gyn)  Complete Medication List: 1)  Lunesta 3 Mg Tabs (Eszopiclone) .... One by mouth at bedtime as needed 2)  Zoloft 50 Mg Tabs (Sertraline hcl) .Marland Kitchen.. 1 by mouth each am 3)  Oxycodone-acetaminophen 5-325 Mg Tabs (Oxycodone-acetaminophen) .... Take one tablet three  times a day as needed for pain 4)  Ibuprofen 200 Mg Tabs (Ibuprofen) .... Otc as directed. 5)  Vitamin D 1000 Unit Tabs (Cholecalciferol) .... Take one daily 6)  Metrogel-vaginal 0.75 % Gel (Metronidazole) .Marland Kitchen.. 1 applicator intravaginally at bedtime for 7 days 7)  Fluconazole 150 Mg Tabs (Fluconazole) .... Take one by mouth times one for yeast infection  Patient Instructions: 1)  I sent px for bacterial vaginitis and yeast to your drugstore 2)  use both as directed  3)  udate me if not improving  4)  try to eat yogurt daily for the good probiotic benefit  Prescriptions: FLUCONAZOLE 150 MG TABS (FLUCONAZOLE) take one by mouth times one for yeast infection  #1 x 0   Entered and Authorized by:   Judith Part MD   Signed by:   Judith Part MD on 08/23/2009   Method used:    Electronically to        [facility] (retail)       [address]       [place], [place]  [id number]       Ph: [phone]       Fax: [phone]   RxID:   [id number] METROGEL-VAGINAL 0.75 % GEL (METRONIDAZOLE) 1 applicator intravaginally at bedtime for 7 days  #1 course x 0   Entered and Authorized by:   Judith Part MD   Signed by:   Judith Part MD on 08/23/2009   Method used:   Electronically to        [facility] (retail)       [address]       [place], [place]  [id number]       Ph: [phone]       Fax: [phone]   RxID:   [id number]   Current Allergies (reviewed today): ! FOSAMAX ! [facility]  Laboratory Results  Wet [place]/KOH Source: vaginal  WBC/hpf 5-10 Bacteria/hpf 1+  Rods Clue cells/hpf moderate  Negative whiff Yeast/hpf few KOH Negative Trichomonas/hpf none

## 2010-06-28 NOTE — Letter (Signed)
Summary: [facility]   Imported By: Lanelle Bal 11/10/2009 09:59:17  _____________________________________________________________________  External Attachment:    Type:   Image     Comment:   External Document

## 2010-06-28 NOTE — Letter (Signed)
Summary: [facility]  [facility]   Imported By: Lanelle Bal 09/07/2009 13:44:59  _____________________________________________________________________  External Attachment:    Type:   Image     Comment:   External Document

## 2010-06-28 NOTE — Letter (Signed)
Summary: [facility]   Imported By: Lester Lynden 11/25/2009 11:52:26  _____________________________________________________________________  External Attachment:    Type:   Image     Comment:   External Document

## 2010-06-28 NOTE — Assessment & Plan Note (Signed)
Summary: vaginal discharge w/ odor and burning /alc   Vital Signs:  Patient profile:   70 year old female Height:      60.5 inches Weight:      158.50 pounds BMI:     30.56 Temp:     98.1 degrees F oral Pulse rate:   80 / minute Pulse rhythm:   regular BP sitting:   136 / 80  (left arm) Cuff size:   regular  Vitals Entered By: Lewanda Rife LPN (December 30, 2009 10:10 AM) CC: vaginal discharge with odor and burning and itching also hemorrhoids   History of Present Illness: here with symptoms of vaginitis  vaginal discharge and odor and burning  d/c is white  some loose stool - upset stomach lately and that has flared her hemorroids  has trouble keeping clean-  ? if sphincter muscle is weak     hers both bleed -- little bit on tissue  also itch and burn  has used prep H suppositories 2-3 times per week  they help a little bit   is off nubetone now -- ? if was causing stomach upset   has also been very stressed -- and her MIL passed - lot of people in the past   had clue cells on wet prep in march -- tx for bact infx and yeast both  also had symptoms? at gyn-- and the wet prep was normal  was checked before and after her ovarian surgery     Allergies: 1)  ! Fosamax 2)  ! Wellbutrin  Past History:  Past Medical History: Last updated: 03-09-2009 Osteopenia sleep disorder kidney stones - with lithotripsy and stent  hyperlipidemia  hyperglycemia  depression vitiligo heart M  deg joint dz tabacco abuse   urology   Past Surgical History: Last updated: 08/23/2009 Carpal tunnel release- bilateral Hemorrhoidectomy Oophorectomy- left, cyst Breast biopsies x 2, left, benign Right hand- trigger finger surgery (2003) Treadmill stress- neg (09/1998) Kidney stones Colonoscopy/ EGD (03/1999) Dexa- mild osteopenia (05/1999), worse (08/2003) Colonoscopy- polyps, diverticulosis (03/2001) Urology work- up for [facility] in urine- normal (2005) Genetic work-up  (01/2005) Colonoscopy- polyps, tics, hem (11/2006) MRI back at [facility] (06/1997) Breast exam and pap at [facility] (06/1997) hand surgery 2/10 kidney stones with lithotripsy and stents  back surgery 2/11 - fusion   Family History: Last updated: Mar 09, 2009 Father: deceased age 59, MI Mother: deceased age 62, brain tumor Siblings: 3 brothers with DM, 1 with colon cancer and CAD.  1 sister deceased from uterine cancer age 28 brother with prostate ca  Social History: Last updated: 03-09-09 Marital Status: Married Children: 2, daughter with HTN Occupation:  smoker -- quit in 9/09 (off and on)   Risk Factors: Smoking Status: current (01/14/2007)  Review of Systems General:  Denies fatigue, fever, and malaise. Eyes:  Denies discharge and eye irritation. ENT:  Denies sore throat. CV:  Denies chest pain or discomfort and palpitations. Resp:  Denies cough, shortness of breath, and wheezing. GI:  Denies abdominal pain, indigestion, nausea, and vomiting. GU:  Complains of discharge and incontinence; denies dysuria, genital sores, and urinary frequency. Derm:  Complains of itching; denies rash. Heme:  Denies abnormal bruising and bleeding.  Physical Exam  General:  overweight but generally well appearing  Head:  normocephalic, atraumatic, and no abnormalities observed.   Mouth:  pharynx pink and moist.   Neck:  supple with full rom and no masses or thyromegally, no JVD or carotid bruit  Lungs:  Normal  respiratory effort, chest expands symmetrically. Lungs are clear to auscultation, no crackles or wheezes. Heart:  Normal rate and regular rhythm. S1 and S2 normal without gallop, murmur, click, rub or other extra sounds. Abdomen:  no suprapubic tenderness or fullness felt  Rectal:  No external abnormalities noted. Normal sphincter tone. No rectal masses or tenderness. ext hemorrhoids noted- non thrombosed --  anoscopy shows very small int hem at 4:00 not actively bleeding  Genitalia:  normal  introitus and no external lesions.  thin vaginal d/c with odor  wet prep obt Extremities:  No clubbing, cyanosis, edema, or deformity noted with normal full range of motion of all joints.   Skin:  Intact without suspicious lesions or rashes Cervical Nodes:  No lymphadenopathy noted Inguinal Nodes:  No significant adenopathy Psych:  normal affect, talkative and pleasant    Impression & Recommendations:  Problem # 1:  VAGINITIS (ICD-616.10) Assessment Deteriorated  with clue cells on wet prep and odor  will tx again for bv and update (handout given) -- flagyl if not imp in 10 days- will ref back to gyn disc use of probiotics or yogurt Her updated medication list for this problem includes:    Metronidazole 500 Mg Tabs (Metronidazole) .Marland Kitchen... 1 by mouth two times a day for 7 days  Orders: Prescription Created Electronically [phone]) Wet Prep [phone])  Problem # 2:  HEMORRHOIDS, EXTERNAL (ICD-455.3) Assessment: New  after bout of diarrhea (pt also thinks sphincter tone is weak-- seemed fairly nl on exam )  will try anusol hc and avoid straining if not imp -consider further eval  Orders: Prescription Created Electronically [phone]) Wet Prep ([id number])  Complete Medication List: 1)  Lunesta 3 Mg Tabs (Eszopiclone) .... One by mouth at bedtime as needed 2)  Zoloft 50 Mg Tabs (Sertraline hcl) .Marland Kitchen.. 1 by mouth each am 3)  Ibuprofen 200 Mg Tabs (Ibuprofen) .... Otc as directed. 4)  Vitamin D 1000 Unit Tabs (Cholecalciferol) .... Take one daily 5)  Nubetone 500mg   .... Take two daily 6)  Metronidazole 500 Mg Tabs (Metronidazole) .Marland Kitchen.. 1 by mouth two times a day for 7 days 7)  Anusol-hc 2.5 % Crea (Hydrocortisone) .... Apply to affected hemorrhoid area once daily for 14 days  Patient Instructions: 1)  take the meds as directed  2)  try to eat a serving of yogurt every day  3)  also you can try align over the counter for probitotic for 10 days 4)  update me in 10 days symptoms are not  improved  Prescriptions: ANUSOL-HC 2.5 % CREA (HYDROCORTISONE) apply to affected hemorrhoid area once daily for 14 days  #1 course x 0   Entered and Authorized by:   Judith Part MD   Signed by:   Judith Part MD on 12/30/2009   Method used:   Electronically to        [facility] (retail)       [address]       [place], [place]  [id number]       Ph: [phone]       Fax: [phone]   RxID:   [id number] METRONIDAZOLE 500 MG TABS (METRONIDAZOLE) 1 by mouth two times a day for 7 days  #14 x 0   Entered and Authorized by:   Judith Part MD   Signed by:   Judith Part MD on 12/30/2009   Method used:   Electronically to  [facility] (retail)       [address]       [place], [place]  [address]       Ph: [phone]       Fax: [phone]   RxID:   [id number]   Current Allergies (reviewed today): ! FOSAMAX ! [facility]  Laboratory Results    Wet [place]/KOH Source: vaginal WBC/hpf 10-20 Bacteria/hpf 1+  Rods Clue cells/hpf few  Positive whiff Yeast/hpf none KOH Negative Trichomonas/hpf none

## 2010-06-30 NOTE — Progress Notes (Signed)
Summary: Rx Lunesta  Phone Note Refill Request Call back at [phone] Message from:  Asher-McAdams on May 18, 2010 2:49 PM  Refills Requested: Medication #1:  LUNESTA 3 MG  TABS one by mouth at bedtime as needed   Last Refilled: 03/17/2010 Received faxed refill request please advise.   Method Requested: Telephone to Pharmacy Initial call taken by: Linde Gillis CMA Duncan Dull),  May 18, 2010 2:49 PM  Follow-up for Phone Call        px written on EMR for call in  Follow-up by: Judith Part MD,  May 18, 2010 4:43 PM  Additional Follow-up for Phone Call Additional follow up Details #1::        Medication phoned to [facility] pharmacy as instructed. Lewanda Rife LPN  May 18, 2010 5:08 PM     New/Updated Medications: LUNESTA 3 MG  TABS (ESZOPICLONE) one by mouth at bedtime as needed Prescriptions: LUNESTA 3 MG  TABS (ESZOPICLONE) one by mouth at bedtime as needed  #30 x 5   Entered and Authorized by:   Judith Part MD   Signed by:   Lewanda Rife LPN on 95/62/1308   Method used:   Telephoned to ...       [facility] (retail)       [address]       [place], [place]  [address]       Ph: [phone]       Fax: [phone]   RxID:   [id number]

## 2010-07-14 ENCOUNTER — Telehealth: Payer: Self-pay | Admitting: Family Medicine

## 2010-07-20 NOTE — Progress Notes (Signed)
Summary: Sandy Harper  Phone Note Call from Patient   Caller: Patient Call For: Judith Part MD Summary of Call: Patient calling and said that gyn changed sleeping meds to [place] instead of lunesta . Patient want to know if you will write a rx to last her until april for cpx.  asher-mcadam Initial call taken by: Benny Lennert CMA Duncan Dull),  July 14, 2010 4:45 PM  Follow-up for Phone Call        that med needs to come from one doctor only since it is a controlled substance -- so I need a note from her gyn that gives me permission to take over the refils  Follow-up by: Judith Part MD,  July 14, 2010 5:04 PM  Additional Follow-up for Phone Call Additional follow up Details #1::        Left message on voicemail  to return call. Delilah Shan CMA Duncan Dull)  July 15, 2010 2:44 PM   Patient Advised.   She says she will get her GYN to send a note to that effect.  Delilah Shan CMA Duncan Dull)  July 15, 2010 5:38 PM

## 2010-07-21 ENCOUNTER — Encounter: Payer: Self-pay | Admitting: Family Medicine

## 2010-08-01 ENCOUNTER — Encounter: Payer: Self-pay | Admitting: Family Medicine

## 2010-08-02 ENCOUNTER — Encounter: Payer: Self-pay | Admitting: Family Medicine

## 2010-08-09 NOTE — Letter (Signed)
Summary: Dr.Lynde Knowles-Jonas-re: medication management  Dr.Lynde Knowles-Jonas-re: medication management   Imported By: Beau Fanny 07/26/2010 11:15:41  _____________________________________________________________________  External Attachment:    Type:   Image     Comment:   External Document  Appended Document: Dr.Lynde Knowles-Jonas-re: medication management please let pt know I got the note from gyn about taking over her meds px written on EMR for call in for ambien  let me know if any problems   Patient notified. Rx called to pharmacy.   Clinical Lists Changes  Medications: Removed medication of LUNESTA 3 MG  TABS (ESZOPICLONE) one by mouth at bedtime as needed - Signed Added new medication of AMBIEN 10 MG TABS (ZOLPIDEM TARTRATE) 1 by mouth at bedtime as needed - Signed Rx of AMBIEN 10 MG TABS (ZOLPIDEM TARTRATE) 1 by mouth at bedtime as needed;  #30 x 0;  Signed;  Entered by: Judith Part MD;  Authorized by: Judith Part MD;  Method used: Telephoned to [facility] [address], [place], [place], [place]  [id number], Ph: [phone], Fax: [phone]    Prescriptions: AMBIEN 10 MG TABS (ZOLPIDEM TARTRATE) 1 by mouth at bedtime as needed  #30 x 0   Entered and Authorized by:   Judith Part MD   Signed by:   Judith Part MD on 07/31/2010   Method used:   Telephoned to ...       [facility] (retail)       [address]       [place], [place]  [id number]       Ph: [phone]       Fax: [phone]   RxID:   [id number]

## 2010-08-15 LAB — URINE MICROSCOPIC-ADD ON

## 2010-08-15 LAB — URINALYSIS, ROUTINE W REFLEX MICROSCOPIC
Nitrite: NEGATIVE
Specific Gravity, Urine: 1.01 (ref 1.005–1.030)
Urobilinogen, UA: 0.2 mg/dL (ref 0.0–1.0)
pH: 7 (ref 5.0–8.0)

## 2010-08-15 LAB — COMPREHENSIVE METABOLIC PANEL
Albumin: 4.2 g/dL (ref 3.5–5.2)
Alkaline Phosphatase: 92 U/L (ref 39–117)
BUN: 13 mg/dL (ref 6–23)
Creatinine, Ser: 0.76 mg/dL (ref 0.4–1.2)
Glucose, Bld: 106 mg/dL — ABNORMAL HIGH (ref 70–99)
Potassium: 4.2 mEq/L (ref 3.5–5.1)
Total Bilirubin: 0.3 mg/dL (ref 0.3–1.2)
Total Protein: 7.3 g/dL (ref 6.0–8.3)

## 2010-08-15 LAB — CBC
MCHC: 34.2 g/dL (ref 30.0–36.0)
RDW: 14.8 % (ref 11.5–15.5)

## 2010-08-15 LAB — TYPE AND SCREEN: Antibody Screen: NEGATIVE

## 2010-08-17 LAB — TYPE AND SCREEN
ABO/RH(D): A POS
Antibody Screen: NEGATIVE

## 2010-08-17 LAB — CBC
HCT: 42.7 % (ref 36.0–46.0)
Hemoglobin: 14.4 g/dL (ref 12.0–15.0)
MCHC: 33.8 g/dL (ref 30.0–36.0)
MCV: 97.7 fL (ref 78.0–100.0)
Platelets: 265 10*3/uL (ref 150–400)
RBC: 4.37 MIL/uL (ref 3.87–5.11)
RDW: 15.3 % (ref 11.5–15.5)
WBC: 7.3 10*3/uL (ref 4.0–10.5)

## 2010-08-17 LAB — SURGICAL PCR SCREEN
MRSA, PCR: NEGATIVE
Staphylococcus aureus: NEGATIVE

## 2010-08-17 LAB — ABO/RH: ABO/RH(D): A POS

## 2010-09-07 LAB — URINALYSIS, ROUTINE W REFLEX MICROSCOPIC
Glucose, UA: NEGATIVE mg/dL
Nitrite: NEGATIVE
Protein, ur: NEGATIVE mg/dL

## 2010-09-07 LAB — APTT: aPTT: 27 seconds (ref 24–37)

## 2010-09-07 LAB — CBC
HCT: 37.5 % (ref 36.0–46.0)
MCHC: 34 g/dL (ref 30.0–36.0)
MCV: 96 fL (ref 78.0–100.0)
Platelets: 503 10*3/uL — ABNORMAL HIGH (ref 150–400)
RDW: 13.8 % (ref 11.5–15.5)

## 2010-09-07 LAB — URINE MICROSCOPIC-ADD ON

## 2010-09-07 LAB — URINE CULTURE: Colony Count: NO GROWTH

## 2010-09-09 ENCOUNTER — Telehealth: Payer: Self-pay | Admitting: Family Medicine

## 2010-09-09 DIAGNOSIS — M949 Disorder of cartilage, unspecified: Secondary | ICD-10-CM

## 2010-09-09 DIAGNOSIS — E785 Hyperlipidemia, unspecified: Secondary | ICD-10-CM

## 2010-09-09 DIAGNOSIS — R7309 Other abnormal glucose: Secondary | ICD-10-CM

## 2010-09-09 DIAGNOSIS — M899 Disorder of bone, unspecified: Secondary | ICD-10-CM

## 2010-09-09 DIAGNOSIS — K219 Gastro-esophageal reflux disease without esophagitis: Secondary | ICD-10-CM

## 2010-09-09 DIAGNOSIS — E559 Vitamin D deficiency, unspecified: Secondary | ICD-10-CM

## 2010-09-09 NOTE — Telephone Encounter (Signed)
Message copied by Roxy Manns on Fri Sep 09, 2010 10:21 AM ------      Message from: Mills Koller      Created: Thu Sep 08, 2010  3:34 PM       Patient is scheduled for CPX labs, please order future labs, Thanks , Camelia Eng

## 2010-09-09 NOTE — Telephone Encounter (Signed)
Please check vit D and lipid and renal/ hepatic/ cbc with diff and tsh

## 2010-09-13 ENCOUNTER — Other Ambulatory Visit (INDEPENDENT_AMBULATORY_CARE_PROVIDER_SITE_OTHER): Payer: Medicare Other | Admitting: Family Medicine

## 2010-09-13 DIAGNOSIS — R7309 Other abnormal glucose: Secondary | ICD-10-CM

## 2010-09-13 DIAGNOSIS — K219 Gastro-esophageal reflux disease without esophagitis: Secondary | ICD-10-CM

## 2010-09-13 DIAGNOSIS — M899 Disorder of bone, unspecified: Secondary | ICD-10-CM

## 2010-09-13 DIAGNOSIS — E559 Vitamin D deficiency, unspecified: Secondary | ICD-10-CM

## 2010-09-13 DIAGNOSIS — E785 Hyperlipidemia, unspecified: Secondary | ICD-10-CM

## 2010-09-13 LAB — CBC WITH DIFFERENTIAL/PLATELET
Basophils Absolute: 0 10*3/uL (ref 0.0–0.1)
Eosinophils Absolute: 0.2 10*3/uL (ref 0.0–0.7)
HCT: 43.2 % (ref 36.0–46.0)
Lymphs Abs: 2.4 10*3/uL (ref 0.7–4.0)
MCHC: 33.9 g/dL (ref 30.0–36.0)
MCV: 98.7 fl (ref 78.0–100.0)
Monocytes Absolute: 0.7 10*3/uL (ref 0.1–1.0)
Neutro Abs: 4.2 10*3/uL (ref 1.4–7.7)
Platelets: 248 10*3/uL (ref 150.0–400.0)
RDW: 14.7 % — ABNORMAL HIGH (ref 11.5–14.6)

## 2010-09-13 LAB — COMPREHENSIVE METABOLIC PANEL
ALT: 13 U/L (ref 0–35)
AST: 15 U/L (ref 0–37)
Albumin: 3.7 g/dL (ref 3.5–5.2)
Alkaline Phosphatase: 83 U/L (ref 39–117)
BUN: 14 mg/dL (ref 6–23)
CO2: 30 mEq/L (ref 19–32)
Calcium: 9.2 mg/dL (ref 8.4–10.5)
Chloride: 105 mEq/L (ref 96–112)
Creatinine, Ser: 0.7 mg/dL (ref 0.4–1.2)
GFR: 82.66 mL/min (ref >60.00)
Glucose, Bld: 103 mg/dL — ABNORMAL HIGH (ref 70–99)
Potassium: 4.5 mEq/L (ref 3.5–5.1)
Sodium: 141 mEq/L (ref 135–145)
Total Bilirubin: 0.7 mg/dL (ref 0.3–1.2)
Total Protein: 6.5 g/dL (ref 6.0–8.3)

## 2010-09-13 LAB — TSH: TSH: 3.59 u[IU]/mL (ref 0.35–5.50)

## 2010-09-13 LAB — LIPID PANEL
HDL: 57 mg/dL (ref >39.00)
Total CHOL/HDL Ratio: 3
Triglycerides: 167 mg/dL — ABNORMAL HIGH (ref 0.0–149.0)
VLDL: 33.4 mg/dL (ref 0.0–40.0)

## 2010-09-13 LAB — HEMOGLOBIN A1C: Hgb A1c MFr Bld: 6.2 % (ref 4.6–6.5)

## 2010-09-14 LAB — VITAMIN D 25 HYDROXY (VIT D DEFICIENCY, FRACTURES): Vit D, 25-Hydroxy: 22 ng/mL — ABNORMAL LOW (ref 30–89)

## 2010-09-15 ENCOUNTER — Encounter: Payer: Self-pay | Admitting: Family Medicine

## 2010-09-19 ENCOUNTER — Encounter: Payer: Self-pay | Admitting: Family Medicine

## 2010-09-19 ENCOUNTER — Ambulatory Visit (INDEPENDENT_AMBULATORY_CARE_PROVIDER_SITE_OTHER): Payer: Medicare Other | Admitting: Family Medicine

## 2010-09-19 DIAGNOSIS — M899 Disorder of bone, unspecified: Secondary | ICD-10-CM

## 2010-09-19 DIAGNOSIS — N760 Acute vaginitis: Secondary | ICD-10-CM

## 2010-09-19 DIAGNOSIS — E785 Hyperlipidemia, unspecified: Secondary | ICD-10-CM

## 2010-09-19 DIAGNOSIS — F17200 Nicotine dependence, unspecified, uncomplicated: Secondary | ICD-10-CM

## 2010-09-19 DIAGNOSIS — R7309 Other abnormal glucose: Secondary | ICD-10-CM

## 2010-09-19 DIAGNOSIS — G479 Sleep disorder, unspecified: Secondary | ICD-10-CM

## 2010-09-19 DIAGNOSIS — R300 Dysuria: Secondary | ICD-10-CM

## 2010-09-19 DIAGNOSIS — Z1231 Encounter for screening mammogram for malignant neoplasm of breast: Secondary | ICD-10-CM

## 2010-09-19 DIAGNOSIS — E559 Vitamin D deficiency, unspecified: Secondary | ICD-10-CM

## 2010-09-19 DIAGNOSIS — F329 Major depressive disorder, single episode, unspecified: Secondary | ICD-10-CM

## 2010-09-19 LAB — POCT URINALYSIS DIPSTICK
Glucose, UA: NEGATIVE
Ketones, UA: NEGATIVE
Spec Grav, UA: 1.01
Urobilinogen, UA: 0.2

## 2010-09-19 MED ORDER — ZOLPIDEM TARTRATE 10 MG PO TABS: 10.0000 mg | ORAL_TABLET | Freq: Every evening | ORAL | Status: DC | PRN

## 2010-09-19 NOTE — Assessment & Plan Note (Signed)
Stopped zoloft Did not think it worked Not interested in other tx at this time  Pt seems down today

## 2010-09-19 NOTE — Assessment & Plan Note (Addendum)
ua today - shows a few wbc but otherwise clear -- did micro ? Could be from contaminant Urine sent for culture and will update when that returns

## 2010-09-19 NOTE — Assessment & Plan Note (Signed)
Improving with walking  Rev labs Enc to loose wt  Enc to stop sweets and limit starches Re check 3 mo

## 2010-09-19 NOTE — Assessment & Plan Note (Signed)
Nl breast exam Enc monthly self exams Will sched mam

## 2010-09-19 NOTE — Assessment & Plan Note (Signed)
Chronic ? If rel to depression Refilled Remus Loffler which is working fairly Pt aware of habit forming potential

## 2010-09-19 NOTE — Patient Instructions (Addendum)
Please leave a urine sample on the way out - we will call you if anything is abnormal  Increase your vitamin D dose to total of 3000 units daily Schedule labs in 3 months for vit D level and a1c Continue your calcium Work on quitting smoking the best you can  Keep working on healthy low sugar diet  If you are interested in shingles vaccine in future - call your insurance company to see how coverage is and call us to schedule  We will schedule mammogram at check out  I will look for a vulvar specialist to see you and call with a name

## 2010-09-19 NOTE — Assessment & Plan Note (Signed)
Disc in detail risks of smoking and possible outcomes including copd, vascular/ heart disease, cancer , respiratory and sinus infections  Pt voices understanding She states she is not ready to quit 

## 2010-09-19 NOTE — Assessment & Plan Note (Signed)
Not yet at goal Inc dose to 3000 iu daily and re check 3 mo Disc imp to bone health

## 2010-09-19 NOTE — Assessment & Plan Note (Signed)
Pt declines dexa follow up  Aware of risks  Is smoker Disc ca and D

## 2010-09-19 NOTE — Assessment & Plan Note (Signed)
Pt is miserable after seeing 2 gyn doctors  Will look into a ref to a vulvar specialist

## 2010-09-19 NOTE — Progress Notes (Signed)
Subjective:    Patient ID: Sandy Harper, female    DOB: 28-Mar-1941, 70 y.o.   MRN: [id number]  HPI Here for check up of chronic medical problems and to review health mt list  Has been feeling fair  Otherwise - still really tired all the time More stress too- that may add to it  Just does not feel good overall   No blood in urine or fever or nausea or vomiting  Hurts to urinate but thinks that is fromher chronic vaginitis  Urine stings vaginal area  No pelvic pain or new flank/back pain  Hx of D def- recent level was 22 up from 18 Has osteopenia  dexa -- more than 2 years ago at this point  She does not want another bone density  Calcium-- is good about this  Exercise --is walking 3 times per week   Tab status -- is back to smoking   Hyperglycemia - stable with a1c of 6.2 from 6.3  --DM in family  Diet -- is good -- limits sweets  Wt is stable   ptx 08 Td 09 Zoster vaccine-- ? If interested   Mam8/10-- wants to schedule that  Self exam - no new lumps or changes   colonosc 7/08-- due in another year  Brother had colon cancer   Pap 9/10 Has herself chronic vaginitis -- and saw gyn in [place] - told she had atrophic vaginitis (did use estrogen cream) - that did not improve her symptoms Still quite miserable with it  Chronic vaginally itches and burns - and overwiping makes it worse -- at times sore to sit down  Used clotrimazole/ betamethasone 1-0.05% - helps just for a little while if it is really sore  occ a little discharge  Sister had uterine cancer Has lost faith in both her gyn in [place] and [place]   Cholesterol- borderline in the past -- is fairly well controlled with diet LDL was 100 this check- commended on that Goal is 100 or less due to smoking  Lab Results  Component Value Date   CHOL 190 09/13/2010   CHOL 203* 02/09/2009   CHOL 199 10/01/2007   Lab Results  Component Value Date   HDL 57.00 09/13/2010   HDL 40.98 02/09/2009   HDL 11.9 10/01/2007    Lab Results  Component Value Date   LDLCALC 100* 09/13/2010   LDLCALC 119* 10/01/2007   LDLCALC 107* 06/25/2006   Lab Results  Component Value Date   TRIG 167.0* 09/13/2010   TRIG 135.0 02/09/2009   TRIG 129 10/01/2007   Lab Results  Component Value Date   CHOLHDL 3 09/13/2010   CHOLHDL 3 02/09/2009   CHOLHDL 3.6 CALC 10/01/2007    Past Medical History  Diagnosis Date  . Osteopenia   . Sleep disorder   . Kidney stones     With lithotripsy and stent  . Hyperlipidemia   . Hyperglycemia   . Depression   . Vitiligo   . Heart murmur   . Degenerative joint disease   . Tobacco abuse   . Urine WBC increased 2005    Urology work-normal   Past Surgical History  Procedure Date  . Carpal tunnel release     bilateral  . Hemorrhoid surgery   . Oophorectomy     Left cyst  . Breast biopsy     X 2 left, benign  . Trigger finger release 2003    Right hand  . Hand surgery 02/10  . Back  surgery 02/11    Fusion    reports that she has been smoking.  She does not have any smokeless tobacco history on file. Her alcohol and drug histories not on file. family history includes Cancer in her brothers and sister; Diabetes in her brothers; and Heart disease in her brother and father. Allergies  Allergen Reactions  . Alendronate Sodium     REACTION: reflux  . Bupropion Hcl     REACTION: heart palpitations       Review of Systems Review of Systems  Constitutional: Negative for fever, appetite change,  and unexpected weight change.  Eyes: Negative for pain and visual disturbance.  Respiratory: Negative for cough and shortness of breath.   Cardiovascular: Negative.   Gastrointestinal: Negative for nausea, diarrhea and constipation.  Genitourinary: dysuria without urgency/ frequency or new incontinence .  Skin: Negative for pallor or rash , some freckles  Neurological: Negative for weakness, light-headedness, numbness and headaches.  Hematological: Negative for adenopathy. Does not  bruise/bleed easily.  Psychiatric/Behavioral: ? If poss mild depression- pt does not want tx , denies anx          Objective:   Physical Exam  Constitutional: She appears well-developed and well-nourished.       overwt and well appearing   HENT:  Head: Normocephalic and atraumatic.  Right Ear: External ear normal.  Left Ear: External ear normal.  Nose: Nose normal.  Mouth/Throat: Oropharynx is clear and moist.  Eyes: Conjunctivae and EOM are normal. Pupils are equal, round, and reactive to light.  Neck: Normal range of motion. Neck supple. No JVD present. Carotid bruit is not present. Erythema present. No thyromegaly present.  Cardiovascular: Normal rate and regular rhythm.  Exam reveals no gallop.   Murmur heard. Pulmonary/Chest: Effort normal and breath sounds normal. She has no wheezes. She has no rales.  Abdominal: Soft. Bowel sounds are normal. She exhibits no distension, no abdominal bruit and no mass. There is no tenderness.       No suprapubic tenderness    Genitourinary: No breast swelling, tenderness, discharge or bleeding.       Breasts are generally dense  Musculoskeletal: She exhibits no edema and no tenderness.  Lymphadenopathy:    She has no cervical adenopathy.  Neurological: She is alert. She has normal reflexes. No cranial nerve deficit. Coordination normal.  Skin: Skin is warm and dry. No rash noted. No erythema. No pallor.  Psychiatric:       Seemed generally down today- frustrated over her medical conditions  Good eye contact and comm skills, however           Assessment & Plan:

## 2010-09-19 NOTE — Assessment & Plan Note (Signed)
Ok today with healthy diet Rev labs with pt Disc low sat fat diet

## 2010-09-19 NOTE — Progress Notes (Signed)
Patient notified as instructed by telephone that urinalysis looked pretty normal Couple of white cells seen so is sending for urine culture.

## 2010-09-20 ENCOUNTER — Ambulatory Visit: Payer: Self-pay | Admitting: Family Medicine

## 2010-09-21 LAB — URINE CULTURE
Colony Count: NO GROWTH
Organism ID, Bacteria: NO GROWTH

## 2010-09-28 ENCOUNTER — Telehealth: Payer: Self-pay | Admitting: Family Medicine

## 2010-09-28 DIAGNOSIS — R300 Dysuria: Secondary | ICD-10-CM

## 2010-09-28 DIAGNOSIS — N760 Acute vaginitis: Secondary | ICD-10-CM

## 2010-09-28 NOTE — Telephone Encounter (Signed)
Message copied by Roxy Manns on Wed Sep 28, 2010  2:12 PM ------      Message from: Lewanda Rife      Created: Wed Sep 28, 2010  1:56 PM       Pt is not any better and would like for you to refer her to Dr Mia Creek at [facility]. Pt will wait to hear from pt care coordinator.      ----- Message -----         From: Roxy Manns, MD         Sent: 09/22/2010   4:20 PM           To: Yetta Glassman, LPN            Please let pt know that urine cx is negative and that is reassuring       I did find out that Dr Mia Creek at [facility] is specializing in vaginitis and vaginal issues      Would she like me to go ahead and do a referral ?

## 2010-09-28 NOTE — Telephone Encounter (Signed)
I will go ahead and refer and route to [facility]

## 2010-10-03 NOTE — Telephone Encounter (Signed)
Appt was made with Dr Mia Creek on 10/17/2010 patient was notified. MK

## 2010-10-11 ENCOUNTER — Telehealth: Payer: Self-pay | Admitting: Family Medicine

## 2010-10-11 NOTE — Op Note (Signed)
Sandy Harper, Sandy Harper                 ACCOUNT NO.:  [id number]   MEDICAL RECORD NO.:  [id number]          PATIENT TYPE:  AMB   LOCATION:  [facility]                         FACILITY:  [facility]   PHYSICIAN:  [name], M.D.  DATE OF BIRTH:  [birth year]   DATE OF PROCEDURE:  02/27/2007  DATE OF DISCHARGE:                               OPERATIVE REPORT   PREOPERATIVE DIAGNOSIS:  Left ureteral stone with severe hydronephrosis.   POSTOPERATIVE DIAGNOSIS:  Left ureteral stone with severe  hydronephrosis.   PROCEDURES DONE:  1. Cystoscopy.  2. Left retrograde pyelogram.  3. Ureteroscopy.  4. Holmium laser left ureteral stone extraction.  5. Insertion of double-J catheter.   SURGEON:  [name], M.D.   ANESTHESIA:  General.   INDICATION:  The patient is a 70 year old female who was seen in the  office this morning with a history of on-and-off left flank pain since  August 2008.  She was  treated by her primary care physician for  urinary tract infection with Cipro twice.  She continued to have left  flank pain.  About a week ago the pain became severe and was associated  with nausea.  She was again treated with Cipro and because of the  persistence of the symptoms, she had a CT scan yesterday that showed a 9-  mm stone at the left mid ureter with severe proximal  hydroureteronephrosis.  The patient is scheduled now for cystoscopy,  retrograde pyelogram, ureteroscopy, holmium laser of the ureteral stone,  and insertion of double-J catheter.   Under general anesthesia the patient was prepped and draped and placed  in the dorsal lithotomy position.  A panendoscope was inserted in the  bladder.  The bladder mucosa is normal.  There is no stone or tumor in  the bladder.  The ureteral orifices are in normal position and shape.   Retrograde pyelogram:  A cone-tip catheter was passed through the cystoscope into the left  ureteral orifice.  Contrast was then injected through the cone-tip  catheter.  The distal ureter appears normal.  There is a filling defect  at the junction of the distal and mid ureter and contrast would not go  beyond that filling defect.  The cone-tip catheter was then removed.  A  sensor-tip guidewire was passed through the cystoscope and into the left  ureter.  However, the sensor-tip guidewire could not be passed beyond the stone.  It was removed and a Glidewire was passed over an open-ended catheter  and the Glidewire was passed into the ureter and the open-ended catheter  was advanced in the distal ureter and after several maneuvers, the  Glidewire was passed beyond the stone into the renal pelvis and the open-  ended catheter was then passed over the Glidewire into the upper ureter.  The Glidewire was then removed and replaced with the sensor-tip  guidewire.  The cystoscope was then removed.  A semirigid ureteroscope  was then passed in the bladder and in the ureter.  There is some edema  distal to the stone but the ureteroscope was passed through the  edematous ureter and the stone was visualized.  A stone cone was then  passed through the ureteroscope and into the ureter proximal to the  stone.  Then with a #365 microfiber holmium laser, the stone was  fragmented in multiple stone fragments.  Several stone fragments were  then removed with the nitinol basket.  The ureter proximal to the stone  is markedly dilated.  The stone cone was then removed.  The ureteroscope  was removed.  The guidewire was then back-loaded into the cystoscope and  a #6,-26 double-J catheter was passed over the guidewire.  The proximal  curl of the double-J catheter is in the renal pelvis.  The distal curl  is in the bladder.  The bladder was then emptied and the cystoscope and  guidewire were removed.   The patient tolerated the procedure well and left the OR in satisfactory  condition to post anesthesia care unit.      [name], M.D.  Electronically  Signed     MN/MEDQ  D:  02/27/2007  T:  02/28/2007  Job:  [id number]   cc:   Marne A. Tower, MD  [address] [place], [place] [address]

## 2010-10-11 NOTE — Telephone Encounter (Signed)
Patient notified as instructed by telephone. Pt will call Shirlee Limerick tomorrow with her decision of which Dr. To go to.

## 2010-10-11 NOTE — Op Note (Signed)
Sandy Harper, Sandy Harper                 ACCOUNT NO.:  [id number]   MEDICAL RECORD NO.:  [id number]          PATIENT TYPE:  AMB   LOCATION:  DAY                          FACILITY:  [facility]   PHYSICIAN:  [name], M.D.  DATE OF BIRTH:  Apr 05, 1941   DATE OF PROCEDURE:  09/04/2008  DATE OF DISCHARGE:                               OPERATIVE REPORT   PREOPERATIVE DIAGNOSES:  Right ureteral calculi, bilateral renal  calculi.   POSTOPERATIVE DIAGNOSES:  Right ureteral calculi, bilateral renal  calculi.   PROCEDURE DONE:  Cystoscopy, right retrograde pyelogram, ureteroscopy,  holmium laser of the right ureteral stone, stone extraction and  insertion of double-J catheter.   SURGEON:  [name], M.D.   ANESTHESIA:  General.   INDICATIONS:  The patient is a 70 year old female with a history of  renal stone.  She has been having right flank pain.  A CT scan showed  bilateral renal calculi and an 8 x 5 mm stone in the proximal ureter and  two stones in the distal ureter measuring 3 and 6 mm.  She is scheduled  today for cystoscopy, retrograde pyelogram, ureteroscopy, holmium laser  of ureteral stones and insertion of double-J stent.   The patient was identified by her wrist band and proper time-out was  taken.   Under general anesthesia she was prepped and draped and placed in the  dorsal lithotomy position.  A panendoscope was inserted in the bladder.  There is marked edema of the right ureteral orifice and it was difficult  to visualize the right ureteral orifice.  The left ureteral orifice is  normal in position and shape.  There is no stone or tumor in the  bladder.   Retrograde pyelogram:  A Glidewire was passed through the cystoscope and  the open-ended catheter was passed over the Glidewire and the Glidewire  was passed with some difficulty through the ureteral orifice into the  ureter and then the open-ended catheter was advanced over the Glidewire  into the distal ureter.   The Glidewire was then removed.  Contrast was  then injected through the open-ended catheter.  There is a filling  defect in the distal ureter and proximal to that filling defect the  ureter is moderately dilated.  There is another filling defect in the  proximal ureter at the L3 level and proximal to that filling defect the  ureter and collecting system are dilated.  There is a small intrarenal  pelvis.  A sensor-tip guidewire was then passed through the open-ended  catheter and the open-ended catheter was removed.   The cystoscope was removed.  The intramural ureter was then dilated with  the ureteroscope access sheath and a semi-rigid ureteroscope was then  passed in the bladder and in the ureter.  There is a stone in the distal  ureter.  The stone was then fragmented with the holmium laser and  several smaller stone fragments.  Several stone fragments were then  removed out of the ureter with the nitinol basket and dropped in the  bladder.  Because of the edema of the  ureteral orifice it was difficult  to re-access the ureter with the semi-rigid ureteroscope.  I then passed  the ureteroscope access sheath in the ureter without difficulty and then  the flexible ureteroscope was then passed through the ureteroscope  access sheath and advanced to the level of the proximal stone.  There is  marked edema of the ureter at that point.  The stone was visualized, but  because of the edema and the poor visualization of the ureter at that  point, I decided to not to proceed with stone fragmentation.  The  flexible ureteroscope and ureteroscope access sheath were removed.  A  semi-rigid ureteroscope was then passed in the ureter and contrast was  then injected through the ureteroscope.  There is no evidence of  extravasation of contrast.  The ureteroscope was then removed.  The  guidewire was then back loaded into the cystoscope and a #6-French - 26  double-J catheter was passed over the  guidewire.  The proximal curl of  the double-J catheter is in the collecting system.  The distal curl is  in the bladder.  Then the bladder was irrigated and several stone  fragments were removed out of the bladder.  The bladder was then emptied  and the cystoscope removed.   The patient tolerated the procedure well and left the OR in satisfactory  condition to postanesthesia care unit.      [name], M.D.  Electronically Signed     MN/MEDQ  D:  09/04/2008  T:  09/04/2008  Job:  [id number]

## 2010-10-11 NOTE — Telephone Encounter (Signed)
Let her know I really do like Dr Vickey Sages

## 2010-10-11 NOTE — Telephone Encounter (Signed)
Patient called to say that Dr Marya Amsler office called her and said he wouldn't see her as a patient, that she should just see a regular GYN. They made her an appt then called to say that after reviewing the records he felt she should just see someone local. Now she is confused as she has seen Dr Renaldo Fiddler in [facility] and Dr Alcide Goodness in [place]. Says she will call back tomm with who she wants to be referred to. In the meantime do you have any suggestions as to where she should go? Pls advise, Thanks, Shirlee Limerick

## 2010-10-13 NOTE — Telephone Encounter (Signed)
GYn Appt made with Dr Renaldo Fiddler on 10/18/2010 at 10:00am.  Left message on patients answering machine. mK

## 2010-11-10 ENCOUNTER — Other Ambulatory Visit: Payer: Self-pay | Admitting: Neurosurgery

## 2010-11-10 DIAGNOSIS — M545 Low back pain: Secondary | ICD-10-CM

## 2010-11-18 ENCOUNTER — Ambulatory Visit
Admission: RE | Admit: 2010-11-18 | Discharge: 2010-11-18 | Disposition: A | Discharge status: Home / Self Care | Payer: Medicare Other | Source: Ambulatory Visit | Attending: Neurosurgery | Admitting: Neurosurgery

## 2010-11-18 DIAGNOSIS — M545 Low back pain: Secondary | ICD-10-CM

## 2010-11-18 MED ORDER — GADOBENATE DIMEGLUMINE 529 MG/ML IV SOLN
14.0000 mL | Freq: Once | INTRAVENOUS | Status: AC | PRN
  Administered 2010-11-18: 14 mL via INTRAVENOUS

## 2010-12-19 ENCOUNTER — Other Ambulatory Visit: Payer: Medicare Other

## 2011-03-09 LAB — URINE CULTURE: Culture: NO GROWTH

## 2011-03-09 LAB — POCT HEMOGLOBIN-HEMACUE: Hemoglobin: 14.4

## 2011-04-21 ENCOUNTER — Other Ambulatory Visit: Payer: Self-pay | Admitting: *Deleted

## 2011-04-21 MED ORDER — ZOLPIDEM TARTRATE 10 MG PO TABS: 10.0000 mg | ORAL_TABLET | Freq: Every evening | ORAL | Status: DC | PRN

## 2011-04-21 NOTE — Telephone Encounter (Signed)
Faxed request from [facility], last filled 03/20/11.

## 2011-04-21 NOTE — Telephone Encounter (Signed)
Rx called to [facility]. 

## 2011-04-21 NOTE — Telephone Encounter (Signed)
Px written for call in   

## 2011-05-02 ENCOUNTER — Encounter: Payer: Self-pay | Admitting: Family Medicine

## 2011-05-02 ENCOUNTER — Ambulatory Visit (INDEPENDENT_AMBULATORY_CARE_PROVIDER_SITE_OTHER): Payer: Medicare Other | Admitting: Family Medicine

## 2011-05-02 VITALS — BP 126/82 | HR 85 | Temp 98.5°F | Ht 60.5 in | Wt 152.5 lb

## 2011-05-02 DIAGNOSIS — J069 Acute upper respiratory infection, unspecified: Secondary | ICD-10-CM

## 2011-05-02 DIAGNOSIS — J40 Bronchitis, not specified as acute or chronic: Secondary | ICD-10-CM

## 2011-05-02 MED ORDER — AZITHROMYCIN 250 MG PO TABS: ORAL_TABLET | ORAL | Status: AC

## 2011-05-02 MED ORDER — HYDROCOD POLST-CHLORPHEN POLST 10-8 MG/5ML PO LQCR: 5.0000 mL | Freq: Two times a day (BID) | ORAL | Status: DC | PRN

## 2011-05-02 NOTE — Progress Notes (Signed)
SUBJECTIVE:  Sandy Harper is a 70 y.o. female who complains of coryza, congestion, productive cough and fever for 7 days. She denies a history of anorexia and chest pain and admits to a history of asthma. Patient does smoke cigarettes.   Patient Active Problem List  Diagnoses  . UNSPECIFIED VITAMIN D DEFICIENCY  . HYPERLIPIDEMIA, BORDERLINE  . TOBACCO ABUSE  . DEPRESSION  . HEMORRHOIDS, EXTERNAL  . GERD  . CALCULUS, URINARY NOS  . FIBROCYSTIC BREAST DISEASE  . VAGINITIS  . OVARIAN CYST  . VITILIGO  . DEGENERATIVE JOINT DISEASE  . ARTHRALGIA  . BACK PAIN, THORACIC REGION, RIGHT  . MYALGIA  . OSTEOPENIA  . SLEEP DISORDER  . INSOMNIA  . MURMUR  . HYPERGLYCEMIA  . Other screening mammogram  . Dysuria   Past Medical History  Diagnosis Date  . Osteopenia   . Sleep disorder   . Kidney stones     With lithotripsy and stent  . Hyperlipidemia   . Hyperglycemia   . Depression   . Vitiligo   . Heart murmur   . Degenerative joint disease   . Tobacco abuse   . Urine WBC increased 2005    Urology work-normal   Past Surgical History  Procedure Date  . Carpal tunnel release     bilateral  . Hemorrhoid surgery   . Oophorectomy     Left cyst  . Breast biopsy     X 2 left, benign  . Trigger finger release 2003    Right hand  . Hand surgery 02/10  . Back surgery 02/11    Fusion   History  Substance Use Topics  . Smoking status: Current Everyday Smoker -- 0.3 packs/day    Last Attempt to Quit: 02/05/2008  . Smokeless tobacco: Not on file   Comment: Off and on  . Alcohol Use: Not on file   Family History  Problem Relation Age of Onset  . Heart disease Father     MI  . Cancer Sister     uterine cancer  . Cancer Brother     colon  . Diabetes Brother   . Heart disease Brother     CAD  . Diabetes Brother   . Cancer Brother     prostate  . Diabetes Brother    Allergies  Allergen Reactions  . Alendronate Sodium     REACTION: reflux  . Bupropion Hcl    REACTION: heart palpitations   Current Outpatient Prescriptions on File Prior to Visit  Medication Sig Dispense Refill  . cholecalciferol (VITAMIN D) 1000 UNITS tablet Take 1,000 Units by mouth 3 (three) times daily.       . nabumetone (RELAFEN) 500 MG tablet Take 500 mg by mouth daily.        Marland Kitchen zolpidem (AMBIEN) 10 MG tablet Take 1 tablet (10 mg total) by mouth at bedtime as needed.  30 tablet  3   The PMH, PSH, Social History, Family History, Medications, and allergies have been reviewed in [facility], and have been updated if relevant.  OBJECTIVE: BP 126/82  Pulse 85  Temp(Src) 98.5 F (36.9 C) (Oral)  Ht 5' 0.5" (1.537 m)  Wt 152 lb 8 oz (69.174 kg)  BMI 29.29 kg/m2  LMP 05/30/1995  She appears well, vital signs are as noted. Ears normal.  Throat and pharynx normal.  Neck supple. No adenopathy in the neck. Nose is congested. Sinuses non tender. Faint, scattered exp wheeze, right > left  ASSESSMENT:  bronchitis  PLAN: Given that patient is a smoker and  progression of symptoms, will treat for bacterial sinusitis. Symptomatic therapy suggested: push fluids, rest and return office visit prn if symptoms persist or worsen.Call or return to clinic prn if these symptoms worsen or fail to improve as anticipated.

## 2011-05-02 NOTE — Patient Instructions (Signed)
Take antibiotic as directed.  Drink lots of fluids.  Treat sympotmatically with Mucinex, nasal saline irrigation, and Tylenol/Ibuprofen. Also try claritin D or zyrtec D over the counter- two times a day as needed ( have to sign for them at pharmacy). You can use warm compresses.  Cough suppressant at night. Call if not improving as expected in 5-7 days.    

## 2011-05-09 ENCOUNTER — Other Ambulatory Visit: Payer: Self-pay

## 2011-05-09 NOTE — Telephone Encounter (Signed)
That sounds too early for the zolpidem if filled 11/23- please clarify and send back

## 2011-05-09 NOTE — Telephone Encounter (Signed)
Sandy [facility] pharmacy faxed refill request Zolpidem 10 mg/ last filled 04/21/11 and Nabumetone 500 mg/last filled 04/07/11. Pt last saw Dr Dayton Martes on 05/02/11 and pt saw Dr Milinda Antis for ck up 09/20/10.Please advise.

## 2011-05-11 NOTE — Telephone Encounter (Signed)
Spoke with pharmacist at [facility] and Zolpidem was filled on 04/21/11.

## 2011-05-11 NOTE — Telephone Encounter (Signed)
Called pt to see why needed med early and spoke with gentleman pt was not available but he would have her call back.

## 2011-05-12 ENCOUNTER — Telehealth: Payer: Self-pay | Admitting: Internal Medicine

## 2011-05-12 MED ORDER — NABUMETONE 500 MG PO TABS: 500.0000 mg | ORAL_TABLET | Freq: Every day | ORAL | Status: DC

## 2011-05-12 NOTE — Telephone Encounter (Signed)
Spoke with patient and she wasn't aware that she had refills on the 04/21/11 rx, she's ok on Ambien now, ok to fill Nabumetone? Also pt states she's finished the z-pak and still taking the cough syrup at night, but the cough is still there, please advise what else she can do.

## 2011-05-12 NOTE — Telephone Encounter (Signed)
Px written for call in  - since cannot elect transmit to [facility] for nambutone See other phone note about cough

## 2011-05-12 NOTE — Telephone Encounter (Signed)
I want her to get checked out - sat clinic is ok -- needs lungs listened to before px another abx

## 2011-05-12 NOTE — Telephone Encounter (Signed)
rx sent to pharmacy by e-script  

## 2011-05-12 NOTE — Telephone Encounter (Signed)
Patient advised productive cough with greenish-yellow sputum. No fever or facial pain but ears are stopped up. She said she can hear wheezing at night when she lays down and can feel the congestion in her chest, but she's not coughing much of it up.

## 2011-05-12 NOTE — Telephone Encounter (Signed)
Patient called and stated she saw Dr. Dayton Martes on 05/02/11 and she Rx her z-pak and cough medication and now she still doesn't feel any better also she is taking mucinex.  Please advise.

## 2011-05-12 NOTE — Telephone Encounter (Signed)
What symptoms? Productive cough? What color? Fever? Facial pain ? Wheeze?  thanks

## 2011-05-15 ENCOUNTER — Ambulatory Visit (INDEPENDENT_AMBULATORY_CARE_PROVIDER_SITE_OTHER): Payer: Medicare Other | Admitting: Family Medicine

## 2011-05-15 ENCOUNTER — Encounter: Payer: Self-pay | Admitting: Family Medicine

## 2011-05-15 VITALS — BP 120/72 | HR 94 | Temp 98.7°F | Ht 60.5 in | Wt 154.1 lb

## 2011-05-15 DIAGNOSIS — J441 Chronic obstructive pulmonary disease with (acute) exacerbation: Secondary | ICD-10-CM

## 2011-05-15 DIAGNOSIS — J40 Bronchitis, not specified as acute or chronic: Secondary | ICD-10-CM

## 2011-05-15 MED ORDER — PREDNISONE 20 MG PO TABS: 20.0000 mg | ORAL_TABLET | Freq: Every day | ORAL | Status: DC

## 2011-05-15 MED ORDER — PREDNISONE 20 MG PO TABS: 40.0000 mg | ORAL_TABLET | Freq: Every day | ORAL | Status: AC

## 2011-05-15 MED ORDER — HYDROCODONE-HOMATROPINE 5-1.5 MG/5ML PO SYRP: ORAL_SOLUTION | ORAL | Status: AC

## 2011-05-15 MED ORDER — LEVOFLOXACIN 500 MG PO TABS: 500.0000 mg | ORAL_TABLET | Freq: Every day | ORAL | Status: AC

## 2011-05-15 NOTE — Progress Notes (Signed)
  Patient Name: Sandy Harper Date of Birth: [birth year] Age: 70 y.o. Medical Record Number: [id number] Gender: female  History of Present Illness:  Sandy Harper is a 70 y.o. very pleasant female patient who presents with the following:  Very pleasant patient who has a 50+ pack year smoking history who presents with persistant productive cough for 3 weeks. She has also been wheezing. Saw Dr. Dayton Martes about 10 days ago and has taken a zpak. Continues to feel poorly.  No myalgias or arthralgias.  Past Medical History, Surgical History, Social History, Family History, and Problem List have been reviewed in EHR and updated if relevant.  Review of Systems: ROS: GEN: Acute illness details above GI: Tolerating PO intake GU: maintaining adequate hydration and urination Pulm: No SOB Interactive and getting along well at home.  Otherwise, ROS is as per the HPI.   Physical Examination: Filed Vitals:   05/15/11 1527  BP: 120/72  Pulse: 94  Temp: 98.7 F (37.1 C)  TempSrc: Oral  Height: 5' 0.5" (1.537 m)  Weight: 154 lb 1.9 oz (69.908 kg)  SpO2: 98%    Body mass index is 29.60 kg/(m^2).   GEN: A and O x 3. WDWN. NAD.    ENT: Nose clear, ext NML.  No LAD.  No JVD.  TM's clear. Oropharynx clear.  PULM: Normal WOB, no distress. Diffuse wheezing without crackles or rhonchi CV: RRR, no M/G/R, No rubs, No JVD.   EXT: warm and well-perfused, No c/c/e. PSYCH: Pleasant and conversant.   Assessment and Plan: 1. Bronchitis  levofloxacin (LEVAQUIN) 500 MG tablet, HYDROcodone-homatropine (HYCODAN) 5-1.5 MG/5ML syrup, predniSONE (DELTASONE) 20 MG tablet, DISCONTINUED: predniSONE (DELTASONE) 20 MG tablet  2. COPD exacerbation  levofloxacin (LEVAQUIN) 500 MG tablet, HYDROcodone-homatropine (HYCODAN) 5-1.5 MG/5ML syrup, predniSONE (DELTASONE) 20 MG tablet, DISCONTINUED: predniSONE (DELTASONE) 20 MG tablet    Infectious source, but clinical concern for COPD given very long smoking history. Prednisone  for 1 week, LVQ.  Recommended Spirometry when well at next check-up with Dr. Milinda Antis

## 2011-05-16 NOTE — Telephone Encounter (Signed)
Left v/m at  cell for pt to call back. No answer and no v/m at home phone. Pt was seen by Dr Patsy Lager 05/15/11 and wanted to get update on pts condition.

## 2011-05-19 NOTE — Telephone Encounter (Signed)
Pt said after seeing Dr Patsy Lager on 05/15/11 with different antibiotic and prednisone pt is better. She is still coughing but when finishes med if not cleared pt will call back for appt.

## 2011-08-28 ENCOUNTER — Ambulatory Visit (INDEPENDENT_AMBULATORY_CARE_PROVIDER_SITE_OTHER): Payer: Medicare Other | Admitting: Family Medicine

## 2011-08-28 ENCOUNTER — Encounter: Payer: Self-pay | Admitting: Family Medicine

## 2011-08-28 VITALS — BP 128/76 | HR 88 | Temp 98.0°F | Ht 60.5 in | Wt 153.8 lb

## 2011-08-28 DIAGNOSIS — E559 Vitamin D deficiency, unspecified: Secondary | ICD-10-CM

## 2011-08-28 DIAGNOSIS — R252 Cramp and spasm: Secondary | ICD-10-CM

## 2011-08-28 DIAGNOSIS — R7309 Other abnormal glucose: Secondary | ICD-10-CM

## 2011-08-28 DIAGNOSIS — T148XXA Other injury of unspecified body region, initial encounter: Secondary | ICD-10-CM | POA: Insufficient documentation

## 2011-08-28 DIAGNOSIS — M79609 Pain in unspecified limb: Secondary | ICD-10-CM

## 2011-08-28 DIAGNOSIS — E785 Hyperlipidemia, unspecified: Secondary | ICD-10-CM

## 2011-08-28 DIAGNOSIS — M79673 Pain in unspecified foot: Secondary | ICD-10-CM | POA: Insufficient documentation

## 2011-08-28 DIAGNOSIS — Z23 Encounter for immunization: Secondary | ICD-10-CM

## 2011-08-28 DIAGNOSIS — F17200 Nicotine dependence, unspecified, uncomplicated: Secondary | ICD-10-CM

## 2011-08-28 DIAGNOSIS — H6120 Impacted cerumen, unspecified ear: Secondary | ICD-10-CM | POA: Insufficient documentation

## 2011-08-28 NOTE — Progress Notes (Signed)
Subjective:    Patient ID: Sandy Harper, female    DOB: 04-Feb-1941, 71 y.o.   MRN: [id number]  HPI Is here for some issues of concern  Started having some cramps in her lower legs at night - for the past 2-3 mo off and on   Also feet hurt - even aside from cramps  They ache all the time - more often after up and on them for long time   Back still bothers her -- 2 epidurals helped a bit Has had fusion  Now stenosis in a different area -- ref pain to the legs   Bruising - more easily on arms and legs  On nsaids  Has tramadol for severe pain   Had bad bronchitis this winter  Was told she may need spirometry  Not sob in between illnesses   She is overall very angry and frustrated over getting older and the changes that are occuring with her body  Patient Active Problem List  Diagnoses  . UNSPECIFIED VITAMIN D DEFICIENCY  . HYPERLIPIDEMIA, BORDERLINE  . TOBACCO ABUSE  . DEPRESSION  . HEMORRHOIDS, EXTERNAL  . GERD  . CALCULUS, URINARY NOS  . FIBROCYSTIC BREAST DISEASE  . VAGINITIS  . OVARIAN CYST  . VITILIGO  . DEGENERATIVE JOINT DISEASE  . ARTHRALGIA  . BACK PAIN, THORACIC REGION, RIGHT  . MYALGIA  . OSTEOPENIA  . SLEEP DISORDER  . INSOMNIA  . MURMUR  . HYPERGLYCEMIA  . Other screening mammogram  . Dysuria  . Leg cramps  . Foot pain  . Bruising  . Cerumen debris on tympanic membrane   Past Medical History  Diagnosis Date  . Osteopenia   . Sleep disorder   . Kidney stones     With lithotripsy and stent  . Hyperlipidemia   . Hyperglycemia   . Depression   . Vitiligo   . Heart murmur   . Degenerative joint disease   . Tobacco abuse   . Urine WBC increased 2005    Urology work-normal   Past Surgical History  Procedure Date  . Carpal tunnel release     bilateral  . Hemorrhoid surgery   . Oophorectomy     Left cyst  . Breast biopsy     X 2 left, benign  . Trigger finger release 2003    Right hand  . Hand surgery 02/10  . Back surgery 02/11   Fusion   History  Substance Use Topics  . Smoking status: Current Everyday Smoker -- 0.3 packs/day    Last Attempt to Quit: 02/05/2008  . Smokeless tobacco: Not on file   Comment: Off and on  . Alcohol Use: Not on file   Family History  Problem Relation Age of Onset  . Heart disease Father     MI  . Cancer Sister     uterine cancer  . Cancer Brother     colon  . Diabetes Brother   . Heart disease Brother     CAD  . Diabetes Brother   . Cancer Brother     prostate  . Diabetes Brother    Allergies  Allergen Reactions  . Alendronate Sodium     REACTION: reflux  . Bupropion Hcl     REACTION: heart palpitations   Current Outpatient Prescriptions on File Prior to Visit  Medication Sig Dispense Refill  . nabumetone (RELAFEN) 500 MG tablet Take 1 tablet (500 mg total) by mouth daily.  30 tablet  5  . zolpidem (  AMBIEN) 10 MG tablet Take 1 tablet (10 mg total) by mouth at bedtime as needed.  30 tablet  3       Review of Systems Review of Systems  Constitutional: Negative for fever, appetite change,unexpected weight change. pos for fatigue and lack of motivation Eyes: Negative for pain and visual disturbance.  Respiratory: Negative for cough and shortness of breath.   Cardiovascular: Negative for cp or palpitations    Gastrointestinal: Negative for nausea, diarrhea and constipation.  Genitourinary: Negative for urgency and frequency.  Skin: Negative for pallor or rash  pos for bruising MSK pos for aches/ pains and cramping and chronic back pain Neurological: Negative for weakness, light-headedness, numbness and headaches.  Hematological: Negative for adenopathy.pos for bruising  Psychiatric/Behavioral: Negative for dysphoric mood. The patient is not nervous/anxious.          Objective:   Physical Exam  Constitutional: She appears well-developed and well-nourished. No distress.  HENT:  Head: Normocephalic and atraumatic.  Mouth/Throat: Oropharynx is clear and moist.        Scant cerumen stuck directly on L TM  Eyes: Conjunctivae and EOM are normal. Pupils are equal, round, and reactive to light. No scleral icterus.  Neck: Normal range of motion. Neck supple. No JVD present. Carotid bruit is not present. No thyromegaly present.  Cardiovascular: Normal rate, regular rhythm, normal heart sounds and intact distal pulses.  Exam reveals no gallop.   Pulmonary/Chest: Effort normal and breath sounds normal. No respiratory distress. She has no wheezes. She exhibits no tenderness.  Abdominal: Soft. Bowel sounds are normal. She exhibits no distension and no mass. There is no tenderness.  Musculoskeletal: Normal range of motion. She exhibits no edema and no tenderness.  Lymphadenopathy:    She has no cervical adenopathy.  Neurological: She is alert. She has normal reflexes. No cranial nerve deficit. She exhibits normal muscle tone. Coordination normal.  Skin: Skin is warm and dry. No rash noted. No erythema. No pallor.       Some superficial bruising with thin skin on arms bilat   Psychiatric: Her speech is normal and behavior is normal. Thought content normal. Her affect is angry. Her affect is not inappropriate. Cognition and memory are normal. She exhibits a depressed mood.       Very negative/ angry/ frustrated today          Assessment & Plan:

## 2011-08-28 NOTE — Patient Instructions (Signed)
Try debrox ear solution in left ear twice weekly for ear wax  This is over the counter  Labs today for cramps in legs/ sugar and other chronic issues  Go back to taking vitamin D and calcium  Follow up with me in about a month  Also keep thinking about quitting smoking

## 2011-08-29 LAB — COMPREHENSIVE METABOLIC PANEL
ALT: 16 U/L (ref 0–35)
AST: 19 U/L (ref 0–37)
Creatinine, Ser: 0.8 mg/dL (ref 0.4–1.2)
Total Bilirubin: 0.2 mg/dL — ABNORMAL LOW (ref 0.3–1.2)

## 2011-08-29 LAB — LIPID PANEL
Cholesterol: 201 mg/dL — ABNORMAL HIGH (ref 0–200)
Total CHOL/HDL Ratio: 3
Triglycerides: 258 mg/dL — ABNORMAL HIGH (ref 0.0–149.0)
VLDL: 51.6 mg/dL — ABNORMAL HIGH (ref 0.0–40.0)

## 2011-08-29 LAB — CBC WITH DIFFERENTIAL/PLATELET
Basophils Absolute: 0 10*3/uL (ref 0.0–0.1)
Eosinophils Absolute: 0.1 10*3/uL (ref 0.0–0.7)
HCT: 43.5 % (ref 36.0–46.0)
Hemoglobin: 14.5 g/dL (ref 12.0–15.0)
Lymphs Abs: 2 10*3/uL (ref 0.7–4.0)
MCHC: 33.3 g/dL (ref 30.0–36.0)
MCV: 99.2 fl (ref 78.0–100.0)
Monocytes Absolute: 0.6 10*3/uL (ref 0.1–1.0)
Neutro Abs: 7.4 10*3/uL (ref 1.4–7.7)
RDW: 14.3 % (ref 11.5–14.6)

## 2011-08-31 ENCOUNTER — Encounter: Payer: Self-pay | Admitting: *Deleted

## 2011-08-31 NOTE — Assessment & Plan Note (Signed)
Labs today Disc low sat fat diet Will disc at f/u

## 2011-08-31 NOTE — Assessment & Plan Note (Signed)
Likely due to nsaid and age - disc this  Will likely continue nsaid for pain because it helps so much  She is very frustrated, however

## 2011-08-31 NOTE — Assessment & Plan Note (Signed)
Adv to try debrox twice weekly

## 2011-08-31 NOTE — Assessment & Plan Note (Signed)
Labs today for electrolytes  Disc stretching

## 2011-08-31 NOTE — Assessment & Plan Note (Signed)
Disc in detail risks of smoking and possible outcomes including copd, vascular/ heart disease, cancer , respiratory and sinus infections  Pt voices understanding  She states she is not ready to quit at this time  

## 2011-08-31 NOTE — Assessment & Plan Note (Signed)
Labs today Disc imp of wt loss and low glycemic diet  F/u planned to discuss

## 2011-08-31 NOTE — Assessment & Plan Note (Signed)
Ongoing foot pain and cramps  Will check labs today Disc foot ware  Will f/u with ortho Circulation seems fine  ? If start of neuropathy from DM/ hyperglycemia

## 2011-08-31 NOTE — Assessment & Plan Note (Signed)
Labs today and disc at f/u  Disc imp to bone and overall health

## 2011-09-01 ENCOUNTER — Encounter: Payer: Self-pay | Admitting: *Deleted

## 2011-09-01 ENCOUNTER — Telehealth: Payer: Self-pay

## 2011-09-01 NOTE — Telephone Encounter (Signed)
Pt called requesting lab results. Gave results to pt. Pt said she has not been taking her Vitamin D. Pt supposed to take Vitamin D 1000mg  taking one tab three times a day. Pt wants to confirm should she take 3 a day or start taking 6 a day. Pt can reached [phone].

## 2011-09-01 NOTE — Telephone Encounter (Signed)
Patient advised as instructed via telephone, copy of immunization report mailed to her home address.  She needs this as proof that she has had the Zoster vaccine.

## 2011-09-01 NOTE — Telephone Encounter (Signed)
Please start taking 3 a day -- that is 3000 iu daily  Thanks

## 2011-09-27 ENCOUNTER — Encounter: Payer: Self-pay | Admitting: Family Medicine

## 2011-09-27 ENCOUNTER — Ambulatory Visit (INDEPENDENT_AMBULATORY_CARE_PROVIDER_SITE_OTHER): Payer: Medicare Other | Admitting: Family Medicine

## 2011-09-27 VITALS — BP 132/80 | HR 73 | Temp 97.9°F | Ht 60.5 in | Wt 152.2 lb

## 2011-09-27 DIAGNOSIS — Z1231 Encounter for screening mammogram for malignant neoplasm of breast: Secondary | ICD-10-CM

## 2011-09-27 DIAGNOSIS — E785 Hyperlipidemia, unspecified: Secondary | ICD-10-CM

## 2011-09-27 DIAGNOSIS — R7309 Other abnormal glucose: Secondary | ICD-10-CM

## 2011-09-27 DIAGNOSIS — M79673 Pain in unspecified foot: Secondary | ICD-10-CM

## 2011-09-27 DIAGNOSIS — M79609 Pain in unspecified limb: Secondary | ICD-10-CM

## 2011-09-27 DIAGNOSIS — E559 Vitamin D deficiency, unspecified: Secondary | ICD-10-CM

## 2011-09-27 MED ORDER — ZOLPIDEM TARTRATE 10 MG PO TABS: 10.0000 mg | ORAL_TABLET | Freq: Every evening | ORAL | Status: DC | PRN

## 2011-09-27 NOTE — Patient Instructions (Addendum)
Continue the vitamin D at current dose Talk to orthopedics about foot pain Try tonic water diet 4 oz at night to prevent cramps  Stretch well  Stay active Avoid red meat/ fried foods/ egg yolks/ fatty breakfast meats/ butter, cheese and high fat dairy/ and shellfish   Avoid sweets,and cut portions of carbs (starches) - to 2-3 oz per meal  Work on weight loss Follow up in 6 months -with labs prior fasting  We will ref for your mammogram at check out

## 2011-09-27 NOTE — Assessment & Plan Note (Signed)
Imp somatic c/o with more D Continue 3000 iu daily Re check 6 mo and f/u

## 2011-09-27 NOTE — Assessment & Plan Note (Signed)
a1c is 6.0 Borderline Long disc re: wt loss and low glycemic diet to prev DM F/u 6 mo with lab

## 2011-09-27 NOTE — Assessment & Plan Note (Signed)
Good HDL Inc trig- ? Due to sugar Disc goals for lipids and reasons to control them Rev labs with pt Rev low sat fat diet in detail F/u 6 mo

## 2011-09-27 NOTE — Assessment & Plan Note (Signed)
Mam scheduled

## 2011-09-27 NOTE — Progress Notes (Signed)
Subjective:    Patient ID: Sandy Harper, female    DOB: 30-May-1940, 71 y.o.   MRN: [id number]  HPI Here for f/u of chronic conditions  Is actually feeling a bit better   Is doing a little walking on treadmil   Hyperglycemia  a1c 6.0 Diet- is a sweet eater / loves potato, has changed to whole wheat pasta  Wt stable  bmi is 29  bp ok   Lipids  Lab Results  Component Value Date   CHOL 201* 08/29/2011   CHOL 190 09/13/2010   CHOL 203* 02/09/2009   Lab Results  Component Value Date   HDL 64.80 08/29/2011   HDL 57.00 09/13/2010   HDL 46.96 02/09/2009   Lab Results  Component Value Date   LDLCALC 100* 09/13/2010   LDLCALC 119* 10/01/2007   LDLCALC 107* 06/25/2006   Lab Results  Component Value Date   TRIG 258.0* 08/29/2011   TRIG 167.0* 09/13/2010   TRIG 135.0 02/09/2009   Lab Results  Component Value Date   CHOLHDL 3 08/29/2011   CHOLHDL 3 09/13/2010   CHOLHDL 3 02/09/2009   Lab Results  Component Value Date   LDLDIRECT 109.3 08/29/2011   LDLDIRECT 120.1 02/09/2009   trig high  Vit D level 26- mildly low  Did inc her D - 3000 total iu per day   Foot pain - just a little better    Chemistry      Component Value Date/Time   NA 143 08/29/2011 0748   K 4.5 08/29/2011 0748   CL 105 08/29/2011 0748   CO2 28 08/29/2011 0748   BUN 17 08/29/2011 0748   CREATININE 0.8 08/29/2011 0748      Component Value Date/Time   CALCIUM 9.2 08/29/2011 0748   ALKPHOS 78 08/29/2011 0748   AST 19 08/29/2011 0748   ALT 16 08/29/2011 0748   BILITOT 0.2* 08/29/2011 0748      Patient Active Problem List  Diagnoses  . UNSPECIFIED VITAMIN D DEFICIENCY  . HYPERLIPIDEMIA, BORDERLINE  . TOBACCO ABUSE  . DEPRESSION  . HEMORRHOIDS, EXTERNAL  . GERD  . CALCULUS, URINARY NOS  . FIBROCYSTIC BREAST DISEASE  . VAGINITIS  . OVARIAN CYST  . VITILIGO  . DEGENERATIVE JOINT DISEASE  . ARTHRALGIA  . BACK PAIN, THORACIC REGION, RIGHT  . MYALGIA  . OSTEOPENIA  . SLEEP DISORDER  . INSOMNIA  . MURMUR  . HYPERGLYCEMIA  .  Other screening mammogram  . Dysuria  . Leg cramps  . Foot pain  . Bruising  . Cerumen debris on tympanic membrane   Past Medical History  Diagnosis Date  . Osteopenia   . Sleep disorder   . Kidney stones     With lithotripsy and stent  . Hyperlipidemia   . Hyperglycemia   . Depression   . Vitiligo   . Heart murmur   . Degenerative joint disease   . Tobacco abuse   . Urine WBC increased 2005    Urology work-normal   Past Surgical History  Procedure Date  . Carpal tunnel release     bilateral  . Hemorrhoid surgery   . Oophorectomy     Left cyst  . Breast biopsy     X 2 left, benign  . Trigger finger release 2003    Right hand  . Hand surgery 02/10  . Back surgery 02/11    Fusion   History  Substance Use Topics  . Smoking status: Current Everyday Smoker -- 0.3  packs/day    Last Attempt to Quit: 02/05/2008  . Smokeless tobacco: Not on file   Comment: Off and on  . Alcohol Use: Not on file   Family History  Problem Relation Age of Onset  . Heart disease Father     MI  . Cancer Sister     uterine cancer  . Cancer Brother     colon  . Diabetes Brother   . Heart disease Brother     CAD  . Diabetes Brother   . Cancer Brother     prostate  . Diabetes Brother    Allergies  Allergen Reactions  . Alendronate Sodium     REACTION: reflux  . Bupropion Hcl     REACTION: heart palpitations   Current Outpatient Prescriptions on File Prior to Visit  Medication Sig Dispense Refill  . nabumetone (RELAFEN) 500 MG tablet Take 1 tablet (500 mg total) by mouth daily.  30 tablet  5  . zolpidem (AMBIEN) 10 MG tablet Take 1 tablet (10 mg total) by mouth at bedtime as needed.  30 tablet  3      Review of Systems Review of Systems  Constitutional: Negative for fever, appetite change,  and unexpected weight change. pos for fatigue Eyes: Negative for pain and visual disturbance.  Respiratory: Negative for cough and shortness of breath.   Cardiovascular: Negative for  cp or palpitations    Gastrointestinal: Negative for nausea, diarrhea and constipation.  Genitourinary: Negative for urgency and frequency.  Skin: Negative for pallor or rash   MSK pos for aches and pains/ occ leg cramps and chronic foot pain that is improving  Neurological: Negative for weakness, light-headedness, numbness and headaches.  Hematological: Negative for adenopathy. Does not bruise/bleed easily.  Psychiatric/Behavioral: Negative for dysphoric mood. The patient is not nervous/anxious.          Objective:   Physical Exam  Constitutional: She appears well-developed and well-nourished. No distress.       overwt and well appearing   HENT:  Head: Normocephalic and atraumatic.  Mouth/Throat: Oropharynx is clear and moist.  Eyes: Conjunctivae and EOM are normal. Pupils are equal, round, and reactive to light. No scleral icterus.  Neck: Normal range of motion. Neck supple. No JVD present. Carotid bruit is not present. No thyromegaly present.  Cardiovascular: Normal rate, regular rhythm, normal heart sounds and intact distal pulses.  Exam reveals no gallop.   Pulmonary/Chest: Effort normal and breath sounds normal. No respiratory distress. She has no wheezes.  Abdominal: Soft. Bowel sounds are normal. She exhibits no distension, no abdominal bruit and no mass. There is no tenderness.  Musculoskeletal: She exhibits tenderness. She exhibits no edema.       Tenderness over heel and arch of each foot  No acute joint changes   Lymphadenopathy:    She has no cervical adenopathy.  Neurological: She is alert. She has normal reflexes. No cranial nerve deficit. She exhibits normal muscle tone. Coordination normal.  Skin: Skin is warm and dry. No rash noted. No erythema. No pallor.  Psychiatric: She has a normal mood and affect.       Attitude is much less negative today  Better mood - brighter           Assessment & Plan:

## 2011-09-27 NOTE — Assessment & Plan Note (Signed)
Improving  Disc foot wear and support Will check with orthopedics Disc stretching  Vit D helps Keep sugar controlled F/u 6 mo

## 2011-10-26 ENCOUNTER — Ambulatory Visit: Payer: Self-pay | Admitting: Family Medicine

## 2011-10-27 ENCOUNTER — Encounter: Payer: Self-pay | Admitting: Family Medicine

## 2011-10-30 ENCOUNTER — Encounter: Payer: Self-pay | Admitting: Family Medicine

## 2011-10-30 ENCOUNTER — Encounter: Payer: Self-pay | Admitting: *Deleted

## 2011-11-22 ENCOUNTER — Other Ambulatory Visit: Payer: Self-pay | Admitting: Neurosurgery

## 2011-11-22 DIAGNOSIS — M4310 Spondylolisthesis, site unspecified: Secondary | ICD-10-CM

## 2011-11-22 DIAGNOSIS — M48061 Spinal stenosis, lumbar region without neurogenic claudication: Secondary | ICD-10-CM

## 2011-11-22 DIAGNOSIS — M5126 Other intervertebral disc displacement, lumbar region: Secondary | ICD-10-CM

## 2011-12-04 ENCOUNTER — Ambulatory Visit
Admission: RE | Admit: 2011-12-04 | Discharge: 2011-12-04 | Disposition: A | Discharge status: Home / Self Care | Payer: Medicare Other | Source: Ambulatory Visit | Attending: Neurosurgery | Admitting: Neurosurgery

## 2011-12-04 DIAGNOSIS — M48061 Spinal stenosis, lumbar region without neurogenic claudication: Secondary | ICD-10-CM

## 2011-12-04 DIAGNOSIS — M5126 Other intervertebral disc displacement, lumbar region: Secondary | ICD-10-CM

## 2011-12-04 DIAGNOSIS — M4310 Spondylolisthesis, site unspecified: Secondary | ICD-10-CM

## 2011-12-04 MED ORDER — GADOBENATE DIMEGLUMINE 529 MG/ML IV SOLN
14.0000 mL | Freq: Once | INTRAVENOUS | Status: AC | PRN
  Administered 2011-12-04: 14 mL via INTRAVENOUS

## 2012-03-14 ENCOUNTER — Other Ambulatory Visit: Payer: Self-pay | Admitting: *Deleted

## 2012-03-14 MED ORDER — ZOLPIDEM TARTRATE 10 MG PO TABS: 10.0000 mg | ORAL_TABLET | Freq: Every evening | ORAL | Status: DC | PRN

## 2012-03-14 NOTE — Telephone Encounter (Signed)
Faxed refill request. Ok to refill?

## 2012-03-14 NOTE — Telephone Encounter (Signed)
Px written for call in   

## 2012-03-14 NOTE — Telephone Encounter (Signed)
Rx called in as directed.   

## 2012-03-26 ENCOUNTER — Other Ambulatory Visit (INDEPENDENT_AMBULATORY_CARE_PROVIDER_SITE_OTHER): Payer: Medicare Other

## 2012-03-26 DIAGNOSIS — E559 Vitamin D deficiency, unspecified: Secondary | ICD-10-CM

## 2012-03-26 DIAGNOSIS — R7309 Other abnormal glucose: Secondary | ICD-10-CM

## 2012-03-26 DIAGNOSIS — E785 Hyperlipidemia, unspecified: Secondary | ICD-10-CM

## 2012-03-26 LAB — LIPID PANEL
Cholesterol: 204 mg/dL — ABNORMAL HIGH (ref 0–200)
HDL: 59.6 mg/dL (ref >39.00)
Total CHOL/HDL Ratio: 3
Triglycerides: 149 mg/dL (ref 0.0–149.0)

## 2012-03-26 LAB — LDL CHOLESTEROL, DIRECT: Direct LDL: 118.4 mg/dL

## 2012-03-27 LAB — VITAMIN D 25 HYDROXY (VIT D DEFICIENCY, FRACTURES): Vit D, 25-Hydroxy: 28 ng/mL — ABNORMAL LOW (ref 30–89)

## 2012-03-29 ENCOUNTER — Ambulatory Visit: Payer: Medicare Other | Admitting: Family Medicine

## 2012-04-05 ENCOUNTER — Encounter: Payer: Self-pay | Admitting: Family Medicine

## 2012-04-05 ENCOUNTER — Ambulatory Visit (INDEPENDENT_AMBULATORY_CARE_PROVIDER_SITE_OTHER): Payer: Medicare Other | Admitting: Family Medicine

## 2012-04-05 VITALS — BP 144/88 | HR 80 | Temp 98.2°F | Ht 60.5 in | Wt 152.5 lb

## 2012-04-05 DIAGNOSIS — F17200 Nicotine dependence, unspecified, uncomplicated: Secondary | ICD-10-CM

## 2012-04-05 DIAGNOSIS — E785 Hyperlipidemia, unspecified: Secondary | ICD-10-CM

## 2012-04-05 DIAGNOSIS — E559 Vitamin D deficiency, unspecified: Secondary | ICD-10-CM

## 2012-04-05 DIAGNOSIS — R7309 Other abnormal glucose: Secondary | ICD-10-CM

## 2012-04-05 MED ORDER — ERGOCALCIFEROL 1.25 MG (50000 UT) PO CAPS: 50000.0000 [IU] | ORAL_CAPSULE | ORAL | Status: DC

## 2012-04-05 NOTE — Assessment & Plan Note (Signed)
Not coming up as planned Will dose 50,000 iu once weekly for [redacted] weeks along with her daily 3000 Re check level 3 mo

## 2012-04-05 NOTE — Assessment & Plan Note (Signed)
Trig improved with recent dec in sugar and better diet - commended Disc goals for lipids and reasons to control them Rev labs with pt Rev low sat fat diet in detail

## 2012-04-05 NOTE — Progress Notes (Signed)
Subjective:    Patient ID: Sandy Harper, female    DOB: Dec 10, 1940, 71 y.o.   MRN: [id number]  HPI Here for f/u of chonic conditions  Nothing new going on   Still trying to figure out what to do for her back  Epidural is coming up -- last one was in aug  Would consider operating if not improvement   Low d Level now 28 On 3000 iu daily Has osteopenia  Hyperlipidemia Lab Results  Component Value Date   CHOL 204* 03/26/2012   CHOL 201* 08/29/2011   CHOL 190 09/13/2010   Lab Results  Component Value Date   HDL 59.60 03/26/2012   HDL 64.80 08/29/2011   HDL 57.00 09/13/2010   Lab Results  Component Value Date   LDLCALC 100* 09/13/2010   LDLCALC 119* 10/01/2007   LDLCALC 107* 06/25/2006   Lab Results  Component Value Date   TRIG 149.0 03/26/2012   TRIG 258.0* 08/29/2011   TRIG 167.0* 09/13/2010   Lab Results  Component Value Date   CHOLHDL 3 03/26/2012   CHOLHDL 3 08/29/2011   CHOLHDL 3 09/13/2010   Lab Results  Component Value Date   LDLDIRECT 118.4 03/26/2012   LDLDIRECT 109.3 08/29/2011   LDLDIRECT 120.1 02/09/2009   dec in triglycerides with better sugar Is watching diet closely for sugar  Does a little walking on treadmill- hard for her back    Hyperglycemia  a1c down to 5.8-better - very happy about that  Wt is stable with bmi of 29  Smoking status - is smoking sometimes- but not every day  Not quite ready to quit yet - but getting closer No symptoms   Patient Active Problem List  Diagnosis  . UNSPECIFIED VITAMIN D DEFICIENCY  . HYPERLIPIDEMIA, BORDERLINE  . TOBACCO ABUSE  . DEPRESSION  . HEMORRHOIDS, EXTERNAL  . GERD  . CALCULUS, URINARY NOS  . FIBROCYSTIC BREAST DISEASE  . VAGINITIS  . OVARIAN CYST  . VITILIGO  . DEGENERATIVE JOINT DISEASE  . ARTHRALGIA  . BACK PAIN, THORACIC REGION, RIGHT  . MYALGIA  . OSTEOPENIA  . SLEEP DISORDER  . INSOMNIA  . MURMUR  . HYPERGLYCEMIA  . Other screening mammogram  . Dysuria  . Leg cramps  . Foot pain  .  Bruising  . Cerumen debris on tympanic membrane   Past Medical History  Diagnosis Date  . Osteopenia   . Sleep disorder   . Kidney stones     With lithotripsy and stent  . Hyperlipidemia   . Hyperglycemia   . Depression   . Vitiligo   . Heart murmur   . Degenerative joint disease   . Tobacco abuse   . Urine WBC increased 2005    Urology work-normal   Past Surgical History  Procedure Date  . Carpal tunnel release     bilateral  . Hemorrhoid surgery   . Oophorectomy     Left cyst  . Breast biopsy     X 2 left, benign  . Trigger finger release 2003    Right hand  . Hand surgery 02/10  . Back surgery 02/11    Fusion   History  Substance Use Topics  . Smoking status: Current Every Day Smoker -- 0.3 packs/day    Last Attempt to Quit: 02/05/2008  . Smokeless tobacco: Not on file     Comment: Off and on  . Alcohol Use: Yes     Comment: social/ wine   Family History  Problem  Relation Age of Onset  . Heart disease Father     MI  . Cancer Sister     uterine cancer  . Cancer Brother     colon  . Diabetes Brother   . Heart disease Brother     CAD  . Diabetes Brother   . Cancer Brother     prostate  . Diabetes Brother    Allergies  Allergen Reactions  . Alendronate Sodium Other (See Comments)    reflux  . Bupropion Hcl Palpitations     heart palpitations   Current Outpatient Prescriptions on File Prior to Visit  Medication Sig Dispense Refill  . Cholecalciferol (VITAMIN D3) 1000 UNITS CAPS Take 1 capsule by mouth 3 (three) times daily.      . nabumetone (RELAFEN) 500 MG tablet Take 1 tablet (500 mg total) by mouth daily.  30 tablet  5  . zolpidem (AMBIEN) 10 MG tablet Take 1 tablet (10 mg total) by mouth at bedtime as needed.  30 tablet  3       Review of Systems Review of Systems  Constitutional: Negative for fever, appetite change, fatigue and unexpected weight change.  Eyes: Negative for pain and visual disturbance.  Respiratory: Negative for cough  and shortness of breath.   Cardiovascular: Negative for cp or palpitations    Gastrointestinal: Negative for nausea, diarrhea and constipation.  Genitourinary: Negative for urgency and frequency.  Skin: Negative for pallor or rash   MSK pos for back and foot pain, neg for swollen joints  Neurological: Negative for weakness, light-headedness, numbness and headaches.  Hematological: Negative for adenopathy. Does not bruise/bleed easily.  Psychiatric/Behavioral: Negative for dysphoric mood. The patient is not nervous/anxious.         Objective:   Physical Exam  Constitutional: She appears well-developed and well-nourished. No distress.  HENT:  Head: Normocephalic and atraumatic.  Mouth/Throat: Oropharynx is clear and moist.  Eyes: Conjunctivae normal and EOM are normal. Pupils are equal, round, and reactive to light. Right eye exhibits no discharge. Left eye exhibits no discharge. No scleral icterus.  Neck: Normal range of motion. Neck supple. No JVD present. Carotid bruit is not present. No thyromegaly present.  Cardiovascular: Normal rate, regular rhythm, normal heart sounds and intact distal pulses.  Exam reveals no gallop.   Pulmonary/Chest: Effort normal and breath sounds normal. No respiratory distress. She has no wheezes. She has no rales.       Diffusely distant bs   Abdominal: Soft. Bowel sounds are normal. She exhibits no distension, no abdominal bruit and no mass. There is no tenderness.  Musculoskeletal: She exhibits no edema.  Lymphadenopathy:    She has no cervical adenopathy.  Neurological: She is alert. She has normal reflexes. No cranial nerve deficit or sensory deficit. She exhibits normal muscle tone. Coordination normal.  Skin: Skin is warm and dry. No rash noted. No erythema. No pallor.  Psychiatric: She has a normal mood and affect.          Assessment & Plan:

## 2012-04-05 NOTE — Patient Instructions (Addendum)
Sugar control is improved  Keep working on healthy diet and exercise also  Take the weekly vitamin D for 12 weeks Also continue your 3000 iu daily  Re check vitamin D level in 3 months - non fasting lab Follow up in 6 months for annual exam with labs prior

## 2012-04-05 NOTE — Assessment & Plan Note (Signed)
Improved  Lab Results  Component Value Date   HGBA1C 5.8 03/26/2012    Commended Lower sugar diet Disc low impact exercise also

## 2012-04-05 NOTE — Assessment & Plan Note (Signed)
Disc in detail risks of smoking and possible outcomes including copd, vascular/ heart disease, cancer , respiratory and sinus infections  Pt voices understanding Pt not ready to quit yet 

## 2012-07-08 ENCOUNTER — Other Ambulatory Visit: Payer: Medicare Other

## 2012-07-09 ENCOUNTER — Other Ambulatory Visit: Payer: Self-pay | Admitting: Neurosurgery

## 2012-07-09 DIAGNOSIS — M48061 Spinal stenosis, lumbar region without neurogenic claudication: Secondary | ICD-10-CM

## 2012-07-09 DIAGNOSIS — M5137 Other intervertebral disc degeneration, lumbosacral region: Secondary | ICD-10-CM

## 2012-07-09 DIAGNOSIS — M7130 Other bursal cyst, unspecified site: Secondary | ICD-10-CM

## 2012-07-09 DIAGNOSIS — M47817 Spondylosis without myelopathy or radiculopathy, lumbosacral region: Secondary | ICD-10-CM

## 2012-07-15 ENCOUNTER — Other Ambulatory Visit (INDEPENDENT_AMBULATORY_CARE_PROVIDER_SITE_OTHER): Payer: Medicare Other

## 2012-07-15 DIAGNOSIS — E559 Vitamin D deficiency, unspecified: Secondary | ICD-10-CM

## 2012-07-16 ENCOUNTER — Encounter (HOSPITAL_COMMUNITY): Payer: Self-pay

## 2012-07-16 LAB — VITAMIN D 25 HYDROXY (VIT D DEFICIENCY, FRACTURES): Vit D, 25-Hydroxy: 39 ng/mL (ref 30–89)

## 2012-07-17 ENCOUNTER — Ambulatory Visit
Admission: RE | Admit: 2012-07-17 | Discharge: 2012-07-17 | Disposition: A | Discharge status: Home / Self Care | Payer: Medicare Other | Source: Ambulatory Visit | Attending: Neurosurgery | Admitting: Neurosurgery

## 2012-07-17 DIAGNOSIS — M7130 Other bursal cyst, unspecified site: Secondary | ICD-10-CM

## 2012-07-17 DIAGNOSIS — M47817 Spondylosis without myelopathy or radiculopathy, lumbosacral region: Secondary | ICD-10-CM

## 2012-07-17 DIAGNOSIS — M5137 Other intervertebral disc degeneration, lumbosacral region: Secondary | ICD-10-CM

## 2012-07-17 DIAGNOSIS — M51379 Other intervertebral disc degeneration, lumbosacral region without mention of lumbar back pain or lower extremity pain: Secondary | ICD-10-CM

## 2012-07-17 DIAGNOSIS — M48061 Spinal stenosis, lumbar region without neurogenic claudication: Secondary | ICD-10-CM

## 2012-07-18 ENCOUNTER — Encounter (HOSPITAL_COMMUNITY)
Admission: RE | Admit: 2012-07-18 | Discharge: 2012-07-18 | Disposition: A | Discharge status: Home / Self Care | Payer: Medicare Other | Source: Ambulatory Visit | Attending: Neurosurgery | Admitting: Neurosurgery

## 2012-07-18 ENCOUNTER — Encounter (HOSPITAL_COMMUNITY): Payer: Self-pay

## 2012-07-18 ENCOUNTER — Encounter (HOSPITAL_COMMUNITY): Payer: Self-pay | Admitting: Pharmacy Technician

## 2012-07-18 HISTORY — DX: Palpitations: R00.2

## 2012-07-18 LAB — CBC
HCT: 42.8 % (ref 36.0–46.0)
Hemoglobin: 14.8 g/dL (ref 12.0–15.0)
MCH: 34 pg (ref 26.0–34.0)
MCHC: 34.6 g/dL (ref 30.0–36.0)
MCV: 98.4 fL (ref 78.0–100.0)
Platelets: 276 10*3/uL (ref 150–400)
RBC: 4.35 MIL/uL (ref 3.87–5.11)
RDW: 13.3 % (ref 11.5–15.5)
WBC: 8.9 10*3/uL (ref 4.0–10.5)

## 2012-07-18 LAB — BASIC METABOLIC PANEL
BUN: 16 mg/dL (ref 6–23)
CO2: 30 mEq/L (ref 19–32)
Calcium: 10.1 mg/dL (ref 8.4–10.5)
Chloride: 101 mEq/L (ref 96–112)
Creatinine, Ser: 0.64 mg/dL (ref 0.50–1.10)
GFR calc Af Amer: >90 mL/min (ref >90)
GFR calc non Af Amer: 88 mL/min — ABNORMAL LOW (ref >90)
Glucose, Bld: 120 mg/dL — ABNORMAL HIGH (ref 70–99)
Potassium: 4.5 mEq/L (ref 3.5–5.1)
Sodium: 140 mEq/L (ref 135–145)

## 2012-07-18 LAB — TYPE AND SCREEN
ABO/RH(D): A POS
Antibody Screen: NEGATIVE

## 2012-07-18 LAB — SURGICAL PCR SCREEN
MRSA, PCR: POSITIVE — AB
Staphylococcus aureus: POSITIVE — AB

## 2012-07-18 NOTE — Progress Notes (Signed)
Unsure where or when she had last ekg.Had an echocardiogram at least 10 yrs. Ago, states that it was done due to heart murmur, perhaps.

## 2012-07-18 NOTE — Pre-Procedure Instructions (Signed)
Sandy Harper  07/18/2012   Your procedure is scheduled on:  07/25/2012  Report to [facility] at 8:45 AM.  Call this number if you have problems the morning of surgery: [phone]   Remember:   Do not eat food or drink liquids after midnight. On WEDNESDAY  Take these medicines the morning of surgery with A SIP OF WATER: Neurontin, you may take pain medicine if needed    Do not wear jewelry, make-up or nail polish.  Do not wear lotions, powders, or perfumes. You may wear deodorant.  Do not shave 48 hours prior to surgery.  Do not bring valuables to the hospital.  Contacts, dentures or bridgework may not be worn into surgery.  Leave suitcase in the car. After surgery it may be brought to your room.  For patients admitted to the hospital, checkout time is 11:00 AM the day of  discharge.   Patients discharged the day of surgery will not be allowed to drive  home.  Name and phone number of your driver: with spouse  Special Instructions: Shower using CHG 2 nights before surgery and the night before surgery.  If you shower the day of surgery use CHG.  Use special wash - you have one bottle of CHG for all showers.  You should use approximately 1/3 of the bottle for each shower.   Please read over the following fact sheets that you were given: Pain Booklet, Coughing and Deep Breathing, Blood Transfusion Information, MRSA Information and Surgical Site Infection Prevention

## 2012-07-24 MED ORDER — CEFAZOLIN SODIUM-DEXTROSE 2-3 GM-% IV SOLR
2.0000 g | INTRAVENOUS | Status: AC
  Administered 2012-07-25: 2 g via INTRAVENOUS
  Filled 2012-07-24: qty 50

## 2012-07-25 ENCOUNTER — Inpatient Hospital Stay (HOSPITAL_COMMUNITY): Payer: Medicare Other

## 2012-07-25 ENCOUNTER — Encounter (HOSPITAL_COMMUNITY): Payer: Self-pay | Admitting: Anesthesiology

## 2012-07-25 ENCOUNTER — Inpatient Hospital Stay (HOSPITAL_COMMUNITY): Payer: Medicare Other | Admitting: Anesthesiology

## 2012-07-25 ENCOUNTER — Encounter (HOSPITAL_COMMUNITY): Payer: Self-pay | Admitting: *Deleted

## 2012-07-25 ENCOUNTER — Inpatient Hospital Stay (HOSPITAL_COMMUNITY)
Admission: RE | Admit: 2012-07-25 | Discharge: 2012-07-27 | DRG: 460 | Disposition: A | Discharge status: Home / Self Care | Payer: Medicare Other | Source: Ambulatory Visit | Attending: Neurosurgery | Admitting: Neurosurgery

## 2012-07-25 ENCOUNTER — Encounter (HOSPITAL_COMMUNITY)
Admission: RE | Disposition: A | Discharge status: Home / Self Care | Payer: Self-pay | Source: Ambulatory Visit | Attending: Neurosurgery

## 2012-07-25 DIAGNOSIS — Z79899 Other long term (current) drug therapy: Secondary | ICD-10-CM

## 2012-07-25 DIAGNOSIS — K219 Gastro-esophageal reflux disease without esophagitis: Secondary | ICD-10-CM | POA: Diagnosis present

## 2012-07-25 DIAGNOSIS — F3289 Other specified depressive episodes: Secondary | ICD-10-CM | POA: Diagnosis present

## 2012-07-25 DIAGNOSIS — Z01812 Encounter for preprocedural laboratory examination: Secondary | ICD-10-CM

## 2012-07-25 DIAGNOSIS — M47817 Spondylosis without myelopathy or radiculopathy, lumbosacral region: Principal | ICD-10-CM | POA: Diagnosis present

## 2012-07-25 DIAGNOSIS — M5137 Other intervertebral disc degeneration, lumbosacral region: Secondary | ICD-10-CM | POA: Diagnosis present

## 2012-07-25 DIAGNOSIS — E785 Hyperlipidemia, unspecified: Secondary | ICD-10-CM | POA: Diagnosis present

## 2012-07-25 DIAGNOSIS — Z800 Family history of malignant neoplasm of digestive organs: Secondary | ICD-10-CM

## 2012-07-25 DIAGNOSIS — Z8042 Family history of malignant neoplasm of prostate: Secondary | ICD-10-CM

## 2012-07-25 DIAGNOSIS — Z8249 Family history of ischemic heart disease and other diseases of the circulatory system: Secondary | ICD-10-CM

## 2012-07-25 DIAGNOSIS — Z8049 Family history of malignant neoplasm of other genital organs: Secondary | ICD-10-CM

## 2012-07-25 DIAGNOSIS — Z833 Family history of diabetes mellitus: Secondary | ICD-10-CM

## 2012-07-25 DIAGNOSIS — F17200 Nicotine dependence, unspecified, uncomplicated: Secondary | ICD-10-CM | POA: Diagnosis present

## 2012-07-25 DIAGNOSIS — F329 Major depressive disorder, single episode, unspecified: Secondary | ICD-10-CM | POA: Diagnosis present

## 2012-07-25 DIAGNOSIS — M51379 Other intervertebral disc degeneration, lumbosacral region without mention of lumbar back pain or lower extremity pain: Secondary | ICD-10-CM | POA: Diagnosis present

## 2012-07-25 SURGERY — POSTERIOR LUMBAR FUSION 1 LEVEL
Anesthesia: General | Site: Back | Laterality: Bilateral | Wound class: Clean

## 2012-07-25 MED ORDER — SODIUM CHLORIDE 0.9 % IV SOLN
INTRAVENOUS | Status: AC
  Filled 2012-07-25: qty 500

## 2012-07-25 MED ORDER — BUPIVACAINE HCL (PF) 0.5 % IJ SOLN
INTRAMUSCULAR | Status: DC | PRN
  Administered 2012-07-25: 10 mL

## 2012-07-25 MED ORDER — PROPOFOL 10 MG/ML IV BOLUS
INTRAVENOUS | Status: DC | PRN
  Administered 2012-07-25: 160 mg via INTRAVENOUS

## 2012-07-25 MED ORDER — VANCOMYCIN HCL IN DEXTROSE 1-5 GM/200ML-% IV SOLN
1000.0000 mg | Freq: Two times a day (BID) | INTRAVENOUS | Status: DC
  Filled 2012-07-25: qty 200

## 2012-07-25 MED ORDER — KETOROLAC TROMETHAMINE 30 MG/ML IJ SOLN
INTRAMUSCULAR | Status: AC
  Filled 2012-07-25: qty 1

## 2012-07-25 MED ORDER — SODIUM CHLORIDE 0.9 % IV SOLN
INTRAVENOUS | Status: DC | PRN
  Administered 2012-07-25: 13:00:00 via INTRAVENOUS

## 2012-07-25 MED ORDER — HYDROXYZINE HCL 50 MG/ML IM SOLN: 50.0000 mg | Freq: Four times a day (QID) | INTRAMUSCULAR | Status: DC | PRN

## 2012-07-25 MED ORDER — HYDROXYZINE HCL 50 MG PO TABS
50.0000 mg | ORAL_TABLET | ORAL | Status: DC | PRN
  Filled 2012-07-25: qty 1

## 2012-07-25 MED ORDER — ONDANSETRON HCL 4 MG/2ML IJ SOLN: 4.0000 mg | Freq: Four times a day (QID) | INTRAMUSCULAR | Status: DC | PRN

## 2012-07-25 MED ORDER — ACETAMINOPHEN 10 MG/ML IV SOLN
INTRAVENOUS | Status: AC
  Administered 2012-07-25: 1000 mg via INTRAVENOUS
  Filled 2012-07-25: qty 100

## 2012-07-25 MED ORDER — PHENYLEPHRINE HCL 10 MG/ML IJ SOLN
INTRAMUSCULAR | Status: DC | PRN
  Administered 2012-07-25 (×2): 80 ug via INTRAVENOUS

## 2012-07-25 MED ORDER — MIDAZOLAM HCL 5 MG/5ML IJ SOLN
INTRAMUSCULAR | Status: DC | PRN
  Administered 2012-07-25: 1 mg via INTRAVENOUS

## 2012-07-25 MED ORDER — MORPHINE SULFATE 4 MG/ML IJ SOLN: 4.0000 mg | INTRAMUSCULAR | Status: DC | PRN

## 2012-07-25 MED ORDER — BISACODYL 10 MG RE SUPP: 10.0000 mg | Freq: Every day | RECTAL | Status: DC | PRN

## 2012-07-25 MED ORDER — 0.9 % SODIUM CHLORIDE (POUR BTL) OPTIME
TOPICAL | Status: DC | PRN
  Administered 2012-07-25: 1000 mL

## 2012-07-25 MED ORDER — VANCOMYCIN HCL 1000 MG IV SOLR
1000.0000 mg | Freq: Two times a day (BID) | INTRAVENOUS | Status: DC
  Filled 2012-07-25: qty 1000

## 2012-07-25 MED ORDER — KETOROLAC TROMETHAMINE 30 MG/ML IJ SOLN
30.0000 mg | Freq: Four times a day (QID) | INTRAMUSCULAR | Status: DC
  Administered 2012-07-26 – 2012-07-27 (×6): 30 mg via INTRAVENOUS
  Filled 2012-07-25 (×9): qty 1

## 2012-07-25 MED ORDER — ONDANSETRON HCL 4 MG/2ML IJ SOLN
INTRAMUSCULAR | Status: DC | PRN
  Administered 2012-07-25: 4 mg via INTRAVENOUS

## 2012-07-25 MED ORDER — NEOSTIGMINE METHYLSULFATE 1 MG/ML IJ SOLN
INTRAMUSCULAR | Status: DC | PRN
  Administered 2012-07-25: 3 mg via INTRAVENOUS

## 2012-07-25 MED ORDER — HYDROMORPHONE HCL PF 1 MG/ML IJ SOLN
0.2500 mg | INTRAMUSCULAR | Status: DC | PRN
  Administered 2012-07-25 (×4): 0.5 mg via INTRAVENOUS

## 2012-07-25 MED ORDER — MENTHOL 3 MG MT LOZG
1.0000 | LOZENGE | OROMUCOSAL | Status: DC | PRN
  Administered 2012-07-26: 3 mg via ORAL
  Filled 2012-07-25: qty 9

## 2012-07-25 MED ORDER — SODIUM CHLORIDE 0.9 % IR SOLN
Status: DC | PRN
  Administered 2012-07-25: 14:00:00

## 2012-07-25 MED ORDER — THROMBIN 20000 UNITS EX SOLR
CUTANEOUS | Status: DC | PRN
  Administered 2012-07-25: 14:00:00 via TOPICAL

## 2012-07-25 MED ORDER — SODIUM CHLORIDE 0.9 % IJ SOLN: 3.0000 mL | INTRAMUSCULAR | Status: DC | PRN

## 2012-07-25 MED ORDER — VITAMIN D3 25 MCG (1000 UNIT) PO TABS
1000.0000 [IU] | ORAL_TABLET | Freq: Three times a day (TID) | ORAL | Status: DC
  Administered 2012-07-26 – 2012-07-27 (×4): 1000 [IU] via ORAL
  Filled 2012-07-25 (×7): qty 1

## 2012-07-25 MED ORDER — OXYCODONE HCL 5 MG PO TABS
5.0000 mg | ORAL_TABLET | ORAL | Status: DC | PRN
  Administered 2012-07-25 – 2012-07-27 (×5): 10 mg via ORAL
  Filled 2012-07-25 (×5): qty 2

## 2012-07-25 MED ORDER — HYDROXYZINE HCL 50 MG/ML IM SOLN: 50.0000 mg | INTRAMUSCULAR | Status: DC | PRN

## 2012-07-25 MED ORDER — ACETAMINOPHEN 10 MG/ML IV SOLN
1000.0000 mg | Freq: Four times a day (QID) | INTRAVENOUS | Status: AC
  Administered 2012-07-26 (×4): 1000 mg via INTRAVENOUS
  Filled 2012-07-25 (×4): qty 100

## 2012-07-25 MED ORDER — DOCUSATE SODIUM 100 MG PO CAPS
100.0000 mg | ORAL_CAPSULE | Freq: Two times a day (BID) | ORAL | Status: DC
  Administered 2012-07-25 – 2012-07-27 (×4): 100 mg via ORAL
  Filled 2012-07-25 (×4): qty 1

## 2012-07-25 MED ORDER — KETOROLAC TROMETHAMINE 30 MG/ML IJ SOLN
30.0000 mg | Freq: Once | INTRAMUSCULAR | Status: AC
  Administered 2012-07-25: 30 mg via INTRAVENOUS

## 2012-07-25 MED ORDER — VANCOMYCIN HCL IN DEXTROSE 1-5 GM/200ML-% IV SOLN
INTRAVENOUS | Status: AC
Start: 2012-07-25 — End: 2012-07-25
  Administered 2012-07-25: 1000 mg via INTRAVENOUS
  Filled 2012-07-25: qty 200

## 2012-07-25 MED ORDER — OXYCODONE HCL 5 MG PO TABS
ORAL_TABLET | ORAL | Status: AC
  Filled 2012-07-25: qty 2

## 2012-07-25 MED ORDER — DIAZEPAM 5 MG/ML IJ SOLN
2.5000 mg | Freq: Once | INTRAMUSCULAR | Status: AC
  Administered 2012-07-25: 2.5 mg via INTRAVENOUS

## 2012-07-25 MED ORDER — MAGNESIUM HYDROXIDE 400 MG/5ML PO SUSP: 30.0000 mL | Freq: Every day | ORAL | Status: DC | PRN

## 2012-07-25 MED ORDER — LACTATED RINGERS IV SOLN
INTRAVENOUS | Status: DC | PRN
  Administered 2012-07-25 (×3): via INTRAVENOUS

## 2012-07-25 MED ORDER — HYDROMORPHONE HCL PF 1 MG/ML IJ SOLN
INTRAMUSCULAR | Status: AC
  Filled 2012-07-25: qty 1

## 2012-07-25 MED ORDER — DEXTROSE-NACL 5-0.45 % IV SOLN: INTRAVENOUS | Status: DC

## 2012-07-25 MED ORDER — ZOLPIDEM TARTRATE 5 MG PO TABS
5.0000 mg | ORAL_TABLET | Freq: Every day | ORAL | Status: DC
  Administered 2012-07-25 – 2012-07-26 (×2): 5 mg via ORAL
  Filled 2012-07-25 (×2): qty 1

## 2012-07-25 MED ORDER — BACITRACIN 50000 UNITS IM SOLR
INTRAMUSCULAR | Status: AC
  Filled 2012-07-25: qty 1

## 2012-07-25 MED ORDER — ARTIFICIAL TEARS OP OINT
TOPICAL_OINTMENT | OPHTHALMIC | Status: DC | PRN
  Administered 2012-07-25: 1 via OPHTHALMIC

## 2012-07-25 MED ORDER — GABAPENTIN 300 MG PO CAPS
300.0000 mg | ORAL_CAPSULE | Freq: Three times a day (TID) | ORAL | Status: DC
  Administered 2012-07-25 – 2012-07-27 (×5): 300 mg via ORAL
  Filled 2012-07-25 (×7): qty 1

## 2012-07-25 MED ORDER — EPHEDRINE SULFATE 50 MG/ML IJ SOLN
INTRAMUSCULAR | Status: DC | PRN
  Administered 2012-07-25: 5 mg via INTRAVENOUS
  Administered 2012-07-25: 10 mg via INTRAVENOUS
  Administered 2012-07-25: 5 mg via INTRAVENOUS
  Administered 2012-07-25: 10 mg via INTRAVENOUS
  Administered 2012-07-25: 5 mg via INTRAVENOUS
  Administered 2012-07-25: 10 mg via INTRAVENOUS
  Administered 2012-07-25: 5 mg via INTRAVENOUS

## 2012-07-25 MED ORDER — PHENOL 1.4 % MT LIQD: 1.0000 | OROMUCOSAL | Status: DC | PRN

## 2012-07-25 MED ORDER — DIAZEPAM 5 MG/ML IJ SOLN
INTRAMUSCULAR | Status: AC
  Filled 2012-07-25: qty 2

## 2012-07-25 MED ORDER — SODIUM CHLORIDE 0.9 % IJ SOLN
3.0000 mL | Freq: Two times a day (BID) | INTRAMUSCULAR | Status: DC
  Administered 2012-07-25 – 2012-07-26 (×3): 3 mL via INTRAVENOUS

## 2012-07-25 MED ORDER — LIDOCAINE-EPINEPHRINE 1 %-1:100000 IJ SOLN
INTRAMUSCULAR | Status: DC | PRN
  Administered 2012-07-25: 10 mL

## 2012-07-25 MED ORDER — ROCURONIUM BROMIDE 100 MG/10ML IV SOLN
INTRAVENOUS | Status: DC | PRN
  Administered 2012-07-25: 10 mg via INTRAVENOUS
  Administered 2012-07-25: 20 mg via INTRAVENOUS
  Administered 2012-07-25: 50 mg via INTRAVENOUS
  Administered 2012-07-25: 10 mg via INTRAVENOUS

## 2012-07-25 MED ORDER — GLYCOPYRROLATE 0.2 MG/ML IJ SOLN
INTRAMUSCULAR | Status: DC | PRN
  Administered 2012-07-25: 0.4 mg via INTRAVENOUS

## 2012-07-25 MED ORDER — ALUM & MAG HYDROXIDE-SIMETH 200-200-20 MG/5ML PO SUSP: 30.0000 mL | Freq: Four times a day (QID) | ORAL | Status: DC | PRN

## 2012-07-25 MED ORDER — ONDANSETRON HCL 4 MG/2ML IJ SOLN
4.0000 mg | Freq: Once | INTRAMUSCULAR | Status: DC | PRN
  Administered 2012-07-25: 4 mg via INTRAVENOUS
  Filled 2012-07-25: qty 2

## 2012-07-25 MED ORDER — ONDANSETRON 8 MG/NS 50 ML IVPB: 8.0000 mg | Freq: Four times a day (QID) | INTRAVENOUS | Status: DC | PRN

## 2012-07-25 MED ORDER — FENTANYL CITRATE 0.05 MG/ML IJ SOLN
INTRAMUSCULAR | Status: DC | PRN
  Administered 2012-07-25 (×2): 50 ug via INTRAVENOUS
  Administered 2012-07-25: 150 ug via INTRAVENOUS
  Administered 2012-07-25 (×2): 100 ug via INTRAVENOUS

## 2012-07-25 MED ORDER — LIDOCAINE HCL (CARDIAC) 20 MG/ML IV SOLN
INTRAVENOUS | Status: DC | PRN
  Administered 2012-07-25: 80 mg via INTRAVENOUS

## 2012-07-25 MED ORDER — CYCLOBENZAPRINE HCL 10 MG PO TABS
10.0000 mg | ORAL_TABLET | Freq: Three times a day (TID) | ORAL | Status: DC | PRN
  Administered 2012-07-25 – 2012-07-26 (×2): 10 mg via ORAL
  Filled 2012-07-25: qty 1

## 2012-07-25 MED ORDER — CYCLOBENZAPRINE HCL 10 MG PO TABS
ORAL_TABLET | ORAL | Status: AC
  Filled 2012-07-25: qty 1

## 2012-07-25 SURGICAL SUPPLY — 76 items
ADH SKN CLS APL DERMABOND .7 (GAUZE/BANDAGES/DRESSINGS) ×1
BAG DECANTER FOR FLEXI CONT (MISCELLANEOUS) ×2 IMPLANT
BLADE SURG ROTATE 9660 (MISCELLANEOUS) IMPLANT
BRUSH SCRUB EZ PLAIN DRY (MISCELLANEOUS) ×2 IMPLANT
BUR ACRON 5.0MM COATED (BURR) ×2 IMPLANT
BUR MATCHSTICK NEURO 3.0 LAGG (BURR) ×2 IMPLANT
CANISTER SUCTION 2500CC (MISCELLANEOUS) ×2 IMPLANT
CAP LCK SPNE (Orthopedic Implant) ×4 IMPLANT
CAP LOCK SPINE RADIUS (Orthopedic Implant) IMPLANT
CAP LOCKING (Orthopedic Implant) ×8 IMPLANT
CLOTH BEACON ORANGE TIMEOUT ST (SAFETY) ×2 IMPLANT
CONT SPEC 4OZ CLIKSEAL STRL BL (MISCELLANEOUS) ×2 IMPLANT
COVER BACK TABLE 24X17X13 BIG (DRAPES) IMPLANT
COVER TABLE BACK 60X90 (DRAPES) ×2 IMPLANT
DERMABOND ADVANCED (GAUZE/BANDAGES/DRESSINGS) ×1
DERMABOND ADVANCED .7 DNX12 (GAUZE/BANDAGES/DRESSINGS) ×2 IMPLANT
DRAPE C-ARM 42X72 X-RAY (DRAPES) ×4 IMPLANT
DRAPE LAPAROTOMY 100X72X124 (DRAPES) ×2 IMPLANT
DRAPE POUCH INSTRU U-SHP 10X18 (DRAPES) ×2 IMPLANT
DRAPE PROXIMA HALF (DRAPES) ×1 IMPLANT
DRSG EMULSION OIL 3X3 NADH (GAUZE/BANDAGES/DRESSINGS) IMPLANT
ELECT REM PT RETURN 9FT ADLT (ELECTROSURGICAL) ×2
ELECTRODE REM PT RTRN 9FT ADLT (ELECTROSURGICAL) ×1 IMPLANT
GAUZE SPONGE 4X4 16PLY XRAY LF (GAUZE/BANDAGES/DRESSINGS) IMPLANT
GLOVE BIOGEL PI IND STRL 6.5 (GLOVE) IMPLANT
GLOVE BIOGEL PI IND STRL 7.0 (GLOVE) IMPLANT
GLOVE BIOGEL PI IND STRL 8 (GLOVE) ×2 IMPLANT
GLOVE BIOGEL PI INDICATOR 6.5 (GLOVE) ×1
GLOVE BIOGEL PI INDICATOR 7.0 (GLOVE) ×2
GLOVE BIOGEL PI INDICATOR 8 (GLOVE) ×2
GLOVE ECLIPSE 7.5 STRL STRAW (GLOVE) ×5 IMPLANT
GLOVE EXAM NITRILE LRG STRL (GLOVE) IMPLANT
GLOVE EXAM NITRILE MD LF STRL (GLOVE) IMPLANT
GLOVE EXAM NITRILE XL STR (GLOVE) IMPLANT
GLOVE EXAM NITRILE XS STR PU (GLOVE) IMPLANT
GLOVE INDICATOR 7.5 STRL GRN (GLOVE) ×1 IMPLANT
GLOVE SS BIOGEL STRL SZ 6.5 (GLOVE) IMPLANT
GLOVE SUPERSENSE BIOGEL SZ 6.5 (GLOVE) ×5
GOWN BRE IMP SLV AUR LG STRL (GOWN DISPOSABLE) ×4 IMPLANT
GOWN BRE IMP SLV AUR XL STRL (GOWN DISPOSABLE) ×4 IMPLANT
GOWN STRL REIN 2XL LVL4 (GOWN DISPOSABLE) IMPLANT
KIT BASIN OR (CUSTOM PROCEDURE TRAY) ×2 IMPLANT
KIT INFUSE SMALL (Orthopedic Implant) ×1 IMPLANT
KIT ROOM TURNOVER OR (KITS) ×2 IMPLANT
MILL MEDIUM DISP (BLADE) ×2 IMPLANT
NDL SPNL 18GX3.5 QUINCKE PK (NEEDLE) IMPLANT
NDL SPNL 22GX3.5 QUINCKE BK (NEEDLE) ×2 IMPLANT
NEEDLE BONE MARROW 8GAX6 (NEEDLE) IMPLANT
NEEDLE SPNL 18GX3.5 QUINCKE PK (NEEDLE) IMPLANT
NEEDLE SPNL 22GX3.5 QUINCKE BK (NEEDLE) ×4 IMPLANT
NS IRRIG 1000ML POUR BTL (IV SOLUTION) ×2 IMPLANT
PACK LAMINECTOMY NEURO (CUSTOM PROCEDURE TRAY) ×2 IMPLANT
PAD ARMBOARD 7.5X6 YLW CONV (MISCELLANEOUS) ×6 IMPLANT
PATTIES SURGICAL .5 X.5 (GAUZE/BANDAGES/DRESSINGS) IMPLANT
PATTIES SURGICAL .5 X1 (DISPOSABLE) IMPLANT
PATTIES SURGICAL 1X1 (DISPOSABLE) IMPLANT
PEEK PLIF AVS 10X25X4 (Peek) ×4 IMPLANT
ROD RADIUS 45MM (Rod) ×2 IMPLANT
ROD SPNL 45X5.5XNS TI RDS (Rod) IMPLANT
SCREW RADIUS 4.75X45MM (Screw) ×2 IMPLANT
SPONGE GAUZE 4X4 12PLY (GAUZE/BANDAGES/DRESSINGS) ×2 IMPLANT
SPONGE LAP 4X18 X RAY DECT (DISPOSABLE) IMPLANT
SPONGE NEURO XRAY DETECT 1X3 (DISPOSABLE) IMPLANT
SPONGE SURGIFOAM ABS GEL 100 (HEMOSTASIS) ×2 IMPLANT
STRIP BIOACTIVE VITOSS 25X100X (Neuro Prosthesis/Implant) ×1 IMPLANT
STRIP BIOACTIVE VITOSS 25X52X4 (Orthopedic Implant) ×1 IMPLANT
SUT VIC AB 1 CT1 18XBRD ANBCTR (SUTURE) ×2 IMPLANT
SUT VIC AB 1 CT1 8-18 (SUTURE) ×4
SUT VIC AB 2-0 CP2 18 (SUTURE) ×4 IMPLANT
SYR 20ML ECCENTRIC (SYRINGE) ×2 IMPLANT
SYR CONTROL 10ML LL (SYRINGE) ×2 IMPLANT
TAPE CLOTH SURG 4X10 WHT LF (GAUZE/BANDAGES/DRESSINGS) ×1 IMPLANT
TOWEL OR 17X24 6PK STRL BLUE (TOWEL DISPOSABLE) ×2 IMPLANT
TOWEL OR 17X26 10 PK STRL BLUE (TOWEL DISPOSABLE) ×2 IMPLANT
TRAY FOLEY CATH 14FRSI W/METER (CATHETERS) ×2 IMPLANT
WATER STERILE IRR 1000ML POUR (IV SOLUTION) ×2 IMPLANT

## 2012-07-25 NOTE — Op Note (Signed)
07/25/2012  4:04 PM  PATIENT:  Sandy Harper  72 y.o. female  PRE-OPERATIVE DIAGNOSIS:  L1-2 Lumbar stenosis, lumbar spondylosis, lumbar degenerative disc disease  POST-OPERATIVE DIAGNOSIS:  L1-2 Lumbar  stenosis, lumbar spondylosis, lumbar degenerative disc disease  PROCEDURE:  Procedure(s): POSTERIOR LUMBAR FUSION 1 LEVEL: Bilateral L1-2 lumbar decompression including a lateral laminectomy, facetectomy, and foraminotomy, with decompression of the exiting L1 and L2 nerve roots bilaterally, with decompression beyond that required for interbody arthrodesis with microdissection and microsurgical technique, bilateral L1-2 posterior lumbar interbody arthrodesis with AVS peek interbody implant and Vitoss BA with bone marrow aspirate and infuse, and bilateral L1-2 posterior lateral arthrodesis with radius posterior instrumentation, Vitoss BA with bone marrow aspirate, and infuse, with removal of previous L3 screws bilaterally  SURGEON:  Surgeon(s): Hewitt Shorts, MD Clydene Fake, MD  ASSISTANTS:  Colon Branch, MD  ANESTHESIA:   general  EBL:  Total I/O In: 2700 [I.V.:2700] Out: 1350 [Urine:1000; Blood:350]  BLOOD ADMINISTERED:none  COUNT:  Correct per nursing staff  DICTATION: Patient is brought to the operating room placed under general endotracheal anesthesia. The patient was turned to prone position the lumbar region was prepped with Betadine soap and solution and draped in a sterile fashion. The midline was infiltrated with local anesthesia with epinephrine. A localizing x-ray was taken and then a midline incision was made carried down through the subcutaneous tissue, bipolar cautery and electrocautery were used to maintain hemostasis. Dissection was carried down to the lumbar fascia. The fascia was incised bilaterally and the paraspinal muscles were dissected with a spinous process and lamina in a subperiosteal fashion. We identified the patient's existing posterior instrumentation  spanning the L2-L3 level. Another x-ray was taken for localization and the L1-2 level was localized. Dissection was then carried out laterally over the L2-3 posterior instrumentation, and the L1-2 facet complex, and the transverse processes of L1 and L2 were exposed and decorticated. We removed the locking caps from the existing posterior instrumentation, removed the rods, and removed the screws bilaterally at L3. The screw holes were packed with Gelfoam with thrombin. We then proceeded with the decompression at the L1-2 level. Bilateral laminectomy was performed using the high-speed drill and Kerrison punches. Dissection was carried out laterally and a bilateral facetectomy was performed again using the high-speed drill and Kerrison punches. The ligamentum flavum which was thickened was carefully removed, and  we were able to decompress the stenotic compression of the thecal sac and the stenotic compression of the exiting L1 and L2 nerve roots bilaterally. The decompression was performed using microdissection and microsurgical technique.  Once the decompression of the stenotic compression of the thecal sac and exiting nerve roots was completed we proceeded with the posterior lumbar interbody arthrodesis. The annulus was incised bilaterally and the disc space entered. A thorough discectomy was performed using pituitary rongeurs and curettes. Once the discectomy was completed we began to prepare the endplate surfaces removing the cartilaginous endplates surface. We then measured the height of the intervertebral disc space. We selected 10 x 25 x 4 AVS peek interbody implants.  The C-arm fluoroscope was then draped and brought in the field and we identified the pedicle entry points bilaterally at the L1 level. Each of the pedicles was probed, we aspirated bone marrow aspirate from the vertebral body, this was injected over a 10 cc and a 5 cc strip of Vitoss BA. Then each of the pedicles was examined with the ball  probe good bony surfaces were found and no  bony cuts were found. Each of the pedicles was then tapped with a 4.25 mm tap, again examined with the ball probe good threading was found and no bony cuts were found. We then placed 4.75 by 45 millimeter screws bilaterally at each level.  We then packed the AVS peek interbody implants with Vitoss BA with bone marrow aspirate and infuse, and then placed the first implant and on the right side, carefully retracting the thecal sac and nerve root medially. We then went back to the left side and packed the midline with additional Vitoss BA with bone marrow aspirate and infuse, and then placed a second implant and on the left side again retracting the thecal sac and nerve root medially. Additional Vitoss BA with bone marrow aspirate and infuse was packed lateral to the implants.  We then packed the lateral gutter over the transverse processes and intertransverse space with Vitoss BA with bone marrow aspirate and infuse. We then selected 45 mm pre-lordosed rods, they were placed within the screw heads and secured with locking caps, once all 4 locking caps were placed final tightening was performed against a counter torque.  The wound had been irrigated multiple times during the procedure with saline solution and bacitracin solution, good hemostasis was established with a combination of bipolar cautery and Gelfoam with thrombin. Once good hemostasis was confirmed we proceeded with closure paraspinal muscles deep fascia and Scarpa's fascia were closed with interrupted undyed 1 Vicryl sutures the subcutaneous and subcuticular closed with interrupted inverted 2-0 undyed Vicryl sutures the skin edges were approximated with Dermabond.  The wound was dressed with sterile gauze and Hypafix.  Following surgery the patient was turned back to the supine position to be reversed and the anesthetic extubated and transferred to the recovery room for further care.   PLAN OF CARE:  Admit to inpatient   PATIENT DISPOSITION:  PACU - hemodynamically stable.   Delay start of Pharmacological VTE agent (>24hrs) due to surgical blood loss or risk of bleeding:  yes

## 2012-07-25 NOTE — Plan of Care (Signed)
Problem: Consults Goal: Diagnosis - Spinal Surgery Outcome: Completed/Met Date Met:  07/25/12 Thoraco/Lumbar Spine Fusion

## 2012-07-25 NOTE — Anesthesia Postprocedure Evaluation (Signed)
  Anesthesia Post-op Note  Patient: Sandy Harper  Procedure(s) Performed: Procedure(s) with comments: POSTERIOR LUMBAR FUSION 1 LEVEL (Bilateral) - Lumbar one-two decompression with posterior lumbar interbody fusion with interbody prothesis posterolateral arthrodesis and posterior nonsegmental instrumentation  Patient Location: PACU  Anesthesia Type:General  Level of Consciousness: awake  Airway and Oxygen Therapy: Patient Spontanous Breathing  Post-op Pain: mild  Post-op Assessment: Post-op Vital signs reviewed  Post-op Vital Signs: stable  Complications: No apparent anesthesia complications

## 2012-07-25 NOTE — Transfer of Care (Signed)
Immediate Anesthesia Transfer of Care Note  Patient: Sandy Harper  Procedure(s) Performed: Procedure(s) with comments: POSTERIOR LUMBAR FUSION 1 LEVEL (Bilateral) - Lumbar one-two decompression with posterior lumbar interbody fusion with interbody prothesis posterolateral arthrodesis and posterior nonsegmental instrumentation  Patient Location: PACU  Anesthesia Type:General  Level of Consciousness: awake, alert , oriented and patient cooperative  Airway & Oxygen Therapy: Patient Spontanous Breathing and Patient connected to nasal cannula oxygen  Post-op Assessment: Report given to PACU RN, Post -op Vital signs reviewed and stable and Patient moving all extremities X 4  Post vital signs: Reviewed and stable  Complications: No apparent anesthesia complications

## 2012-07-25 NOTE — Preoperative (Signed)
Beta Blockers   Reason not to administer Beta Blockers:Not Applicable 

## 2012-07-25 NOTE — Anesthesia Preprocedure Evaluation (Addendum)
Anesthesia Evaluation  Patient identified by MRN, date of birth, ID band Patient awake    Reviewed: Allergy & Precautions, H&P , NPO status , Patient's Chart, lab work & pertinent test results  Airway Mallampati: I TM Distance: >3 FB Neck ROM: full    Dental   Pulmonary sleep apnea , Current Smoker,          Cardiovascular Rhythm:regular Rate:Normal     Neuro/Psych    GI/Hepatic GERD-  ,  Endo/Other    Renal/GU Renal disease     Musculoskeletal   Abdominal   Peds  Hematology   Anesthesia Other Findings   Reproductive/Obstetrics                           Anesthesia Physical Anesthesia Plan  ASA: II  Anesthesia Plan: General   Post-op Pain Management:    Induction: Intravenous  Airway Management Planned: Oral ETT  Additional Equipment:   Intra-op Plan:   Post-operative Plan: Extubation in OR  Informed Consent: I have reviewed the patients History and Physical, chart, labs and discussed the procedure including the risks, benefits and alternatives for the proposed anesthesia with the patient or authorized representative who has indicated his/her understanding and acceptance.     Plan Discussed with: CRNA, Anesthesiologist and Surgeon  Anesthesia Plan Comments:         Anesthesia Quick Evaluation

## 2012-07-25 NOTE — H&P (Signed)
Subjective: Patient is a 72 y.o. 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 She is 3 years status post L2-3 lumbar decompression and arthrodesis. She's had worsening low back and bilateral lower extremity pain. She's been treated over the past several years with anti-inflammatory medications, pain medications, and spinal injections. Unfortunately these become less and less effective for her. She is admitted now for a L1-2 lumbar decompression and arthrodesis.   Patient Active Problem List   Diagnosis Date Noted  . Leg cramps 08/28/2011  . Foot pain 08/28/2011  . Bruising 08/28/2011  . Other screening mammogram 09/19/2010  . HEMORRHOIDS, EXTERNAL 12/30/2009  . VAGINITIS 08/23/2009  . OVARIAN CYST 08/23/2009  . ARTHRALGIA 06/22/2009  . MYALGIA 06/22/2009  . BACK PAIN, THORACIC REGION, RIGHT 03/03/2009  . UNSPECIFIED VITAMIN D DEFICIENCY 11/12/2007  . HYPERLIPIDEMIA, BORDERLINE 10/01/2007  . DEPRESSION 10/01/2007  . GERD 10/01/2007  . HYPERGLYCEMIA 10/01/2007  . CALCULUS, URINARY NOS 02/26/2007  . TOBACCO ABUSE 01/14/2007  . FIBROCYSTIC BREAST DISEASE 01/14/2007  . VITILIGO 01/14/2007  . DEGENERATIVE JOINT DISEASE 01/14/2007  . OSTEOPENIA 01/14/2007  . SLEEP DISORDER 01/14/2007  . INSOMNIA 01/14/2007  . MURMUR 01/14/2007   Past Medical History  Diagnosis Date  . Osteopenia   . Sleep disorder   . Kidney stones     With lithotripsy and stent  . Hyperlipidemia   . Hyperglycemia   . Depression   . Vitiligo   . Tobacco abuse   . Urine WBC increased 2005    Urology work-normal  . Heart murmur     echocardiogram- 10 yrs. ago- wnl  . Heart palpitations 2001    due to stress - pt. feels that it has resolved & it was related to stress at that time  . Degenerative joint disease     lumbar region & L hand - cortisone injection in past     Past Surgical  History  Procedure Laterality Date  . Carpal tunnel release      bilateral  . Hemorrhoid surgery    . Oophorectomy      Left cyst  . Breast biopsy      X 2 left, benign  . Trigger finger release  2003    Right hand  . Hand surgery  02/10  . Back surgery  02/11    1995- decompression, then followed by fusion in 2011  . Tubal ligation      Prescriptions prior to admission  Medication Sig Dispense Refill  . cholecalciferol (VITAMIN D) 1000 UNITS tablet Take 1,000 Units by mouth 3 (three) times daily.      Tery Sanfilippo Calcium (STOOL SOFTENER PO) Take 1-2 capsules by mouth 2 (two) times daily as needed (Constipation).      Marland Kitchen ergocalciferol (VITAMIN D2) 50000 UNITS capsule Take 50,000 Units by mouth once a week. For 12 weeks; Start date 04/05/12      . gabapentin (NEURONTIN) 300 MG capsule Take 300 mg by mouth 3 (three) times daily.      Marland Kitchen HYDROcodone-acetaminophen (NORCO/VICODIN) 5-325 MG per tablet Take 1 tablet by mouth 2 (two) times daily as needed for pain.       . nabumetone (RELAFEN) 500 MG tablet Take 500 mg by mouth 2 (two) times daily.      Marland Kitchen zolpidem (AMBIEN) 10 MG tablet Take 5 mg by mouth at bedtime.       Allergies  Allergen Reactions  .  Alendronate Sodium Other (See Comments)    reflux  . Bupropion Hcl Palpitations     heart palpitations    History  Substance Use Topics  . Smoking status: Current Every Day Smoker -- 0.50 packs/day  . Smokeless tobacco: Not on file     Comment: Off and on  . Alcohol Use: Yes     Comment: social/ wine- once q two weeks     Family History  Problem Relation Age of Onset  . Heart disease Father     MI  . Cancer Sister     uterine cancer  . Cancer Brother     colon  . Diabetes Brother   . Heart disease Brother     CAD  . Diabetes Brother   . Cancer Brother     prostate  . Diabetes Brother      Review of Systems A comprehensive review of systems was negative.  Objective: Vital signs in last 24 hours: Temp:  [98.1 F  (36.7 C)] 98.1 F (36.7 C) (02/27 0853) Pulse Rate:  [66] 66 (02/27 0853) Resp:  [18] 18 (02/27 0853) BP: (133)/(78) 133/78 mmHg (02/27 0853) SpO2:  [96 %] 96 % (02/27 0853)  EXAM: Patient is a well-developed well-nourished white female in no acute distress. Lungs are clear to auscultation , the patient has symmetrical respiratory excursion. Heart has a regular rate and rhythm normal S1 and S2 no murmur.   Abdomen is soft nontender nondistended bowel sounds are present. Extremity examination shows no clubbing cyanosis or edema. Musculoskeletal examination shows negative straight leg raising bilaterally. Motor examination shows 5 over 5 strength in the lower extremities including the iliopsoas quadriceps dorsiflexor extensor hallicus  longus and plantar flexor bilaterally. Sensation is intact to pinprick in the distal lower extremities. Reflexes are symmetrical bilaterally. No pathologic reflexes are present. Patient has a normal gait and stance.   Data Review:CBC    Component Value Date/Time   WBC 8.9 07/18/2012 1620   RBC 4.35 07/18/2012 1620   HGB 14.8 07/18/2012 1620   HCT 42.8 07/18/2012 1620   PLT 276 07/18/2012 1620   MCV 98.4 07/18/2012 1620   MCH 34.0 07/18/2012 1620   MCHC 34.6 07/18/2012 1620   RDW 13.3 07/18/2012 1620   LYMPHSABS 2.0 08/29/2011 0748   MONOABS 0.6 08/29/2011 0748   EOSABS 0.1 08/29/2011 0748   BASOSABS 0.0 08/29/2011 0748                          BMET    Component Value Date/Time   NA 140 07/18/2012 1620   K 4.5 07/18/2012 1620   CL 101 07/18/2012 1620   CO2 30 07/18/2012 1620   GLUCOSE 120* 07/18/2012 1620   BUN 16 07/18/2012 1620   CREATININE 0.64 07/18/2012 1620   CALCIUM 10.1 07/18/2012 1620   GFRNONAA 88* 07/18/2012 1620   GFRAA >90 07/18/2012 1620     Assessment/Plan: Patient with advanced degeneration at the L1-2 level with resulting stenosis. She status post a previous decompression and arthrodesis at L2-3. CT scan shows a solid fusion at that level. She is  admitted now for an L1-2 lumbar decompression including laminectomy, facetectomy, and foraminotomy, and an L1-2 posterior lumbar interbody arthrodesis and posterior lateral arthrodesis with interbody implants, postreduction patient, and bone graft. We will be able to remove the L3 screws. I've discussed with the patient the nature of his condition, the nature the surgical procedure, the typical length of surgery,  hospital stay, and overall recuperation, the limitations postoperatively, and risks of surgery. I discussed risks including risks of infection, bleeding, possibly need for transfusion, the risk of nerve root dysfunction with pain, weakness, numbness, or paresthesias, the risk of dural tear and CSF leakage and possible need for further surgery, the risk of failure of the arthrodesis and possibly for further surgery, the risk of anesthetic complications including myocardial infarction, stroke, pneumonia, and death. We discussed the need for postoperative immobilization in a lumbar brace. Understanding all this the patient does wish to proceed with surgery and is admitted for such.     Hewitt Shorts, MD 07/25/2012 11:33 AM

## 2012-07-26 MED ORDER — CHLORHEXIDINE GLUCONATE CLOTH 2 % EX PADS
6.0000 | MEDICATED_PAD | Freq: Every day | CUTANEOUS | Status: DC
  Administered 2012-07-26 – 2012-07-27 (×2): 6 via TOPICAL

## 2012-07-26 NOTE — Progress Notes (Signed)
UR COMPLETED  

## 2012-07-26 NOTE — Progress Notes (Signed)
Filed Vitals:   07/25/12 1755 07/25/12 2000 07/26/12 0000 07/26/12 0412  BP: 118/61 104/59 89/51 92/55   Pulse: 60 66 74 74  Temp: 97.9 F (36.6 C) 98.7 F (37.1 C) 98.5 F (36.9 C) 98.5 F (36.9 C)  TempSrc:  Oral Oral Oral  Resp: 18 16 14 16   SpO2: 96% 99% 97% 97%    Patient resting in bed, fairly comfortable. Dressing clean and dry. Ambulated last night, we'll ambulate more today. We'll discontinue Foley today.  Plan: Continue to progress to postoperative recovery.  Hewitt Shorts, MD 07/26/2012, 7:41 AM

## 2012-07-27 MED ORDER — OXYCODONE-ACETAMINOPHEN 5-325 MG PO TABS: 1.0000 | ORAL_TABLET | ORAL | Status: DC | PRN

## 2012-07-27 NOTE — Discharge Summary (Signed)
Physician Discharge Summary  Patient ID: Sandy Harper MRN: [id number] DOB/AGE: 71-Aug-1942 70 y.o.  Admit date: 07/25/2012 Discharge date: 07/27/2012  Admission Diagnoses:  Lumbar stenosis, lumbar spondylosis, lumbar degenerative disease  Discharge Diagnoses:   Lumbar stenosis, lumbar spondylosis, lumbar degenerative disc disease  Discharged Condition: good  Hospital Course: Patient was admitted, underwent an L1-2 lumbar decompression and arthrodesis. Postoperatively she has made steady progress. She has steadily become more mobile, with both transfers and ambulation. Her wound is healing well. She is ambulating actively in the halls. She is being discharged home with instructions regarding wound care and activities. She is to return for followup with me in 3 weeks.  Discharge Exam: Blood pressure 103/61, pulse 77, temperature 98.3 F (36.8 C), temperature source Oral, resp. rate 16, last menstrual period 05/30/1995, SpO2 94.00%.  Disposition: Home   Future Appointments Provider Department Dept Phone   09/27/2012 9:00 AM [facility] [place] [phone]   10/04/2012 9:30 AM Judy Pimple, MD [facility] [place] [phone]       Medication List    TAKE these medications       cholecalciferol 1000 UNITS tablet  Commonly known as:  VITAMIN D  Take 1,000 Units by mouth 3 (three) times daily.     ergocalciferol 50000 UNITS capsule  Commonly known as:  VITAMIN D2  Take 50,000 Units by mouth once a week. For 12 weeks; Start date 04/05/12     gabapentin 300 MG capsule  Commonly known as:  NEURONTIN  Take 300 mg by mouth 3 (three) times daily.     HYDROcodone-acetaminophen 5-325 MG per tablet  Commonly known as:  NORCO/VICODIN  Take 1 tablet by mouth 2 (two) times daily as needed for pain.     nabumetone 500 MG tablet  Commonly known as:  RELAFEN  Take 500 mg by mouth 2 (two) times daily.     oxyCODONE-acetaminophen 5-325 MG per tablet   Commonly known as:  PERCOCET/ROXICET  Take 1-2 tablets by mouth every 4 (four) hours as needed for pain.     STOOL SOFTENER PO  Take 1-2 capsules by mouth 2 (two) times daily as needed (Constipation).     zolpidem 10 MG tablet  Commonly known as:  AMBIEN  Take 5 mg by mouth at bedtime.         Signed: Hewitt Shorts, MD 07/27/2012, 8:01 AM

## 2012-09-04 ENCOUNTER — Telehealth: Payer: Self-pay

## 2012-09-04 NOTE — Telephone Encounter (Signed)
Pt left v/m that had lab testing done 06/2012 re: vit D; pt request call back with results. Pt also request confirmation of scheduled appts.Please advise.

## 2012-09-04 NOTE — Telephone Encounter (Signed)
Pt notified of lab results and I confirmed her CPE/lab appts in May

## 2012-09-04 NOTE — Telephone Encounter (Signed)
D level was 39, improved -continue current dose of D

## 2012-09-24 ENCOUNTER — Telehealth: Payer: Self-pay | Admitting: Family Medicine

## 2012-09-24 DIAGNOSIS — Z0000 Encounter for general adult medical examination without abnormal findings: Secondary | ICD-10-CM | POA: Insufficient documentation

## 2012-09-24 DIAGNOSIS — E785 Hyperlipidemia, unspecified: Secondary | ICD-10-CM

## 2012-09-24 DIAGNOSIS — M949 Disorder of cartilage, unspecified: Secondary | ICD-10-CM

## 2012-09-24 DIAGNOSIS — K219 Gastro-esophageal reflux disease without esophagitis: Secondary | ICD-10-CM

## 2012-09-24 DIAGNOSIS — E559 Vitamin D deficiency, unspecified: Secondary | ICD-10-CM

## 2012-09-24 DIAGNOSIS — R7309 Other abnormal glucose: Secondary | ICD-10-CM

## 2012-09-24 NOTE — Telephone Encounter (Signed)
Message copied by Judy Pimple on Tue Sep 24, 2012  8:31 AM ------      Message from: Alvina Chou      Created: Tue Sep 17, 2012 12:44 PM      Regarding: Lab orders for Thursday, 5.1.14       Patient is scheduled for CPX labs, please order future labs, Thanks , Terri       ------

## 2012-09-26 ENCOUNTER — Other Ambulatory Visit (INDEPENDENT_AMBULATORY_CARE_PROVIDER_SITE_OTHER): Payer: Medicare Other

## 2012-09-26 DIAGNOSIS — E785 Hyperlipidemia, unspecified: Secondary | ICD-10-CM

## 2012-09-26 DIAGNOSIS — R7309 Other abnormal glucose: Secondary | ICD-10-CM

## 2012-09-26 DIAGNOSIS — K219 Gastro-esophageal reflux disease without esophagitis: Secondary | ICD-10-CM

## 2012-09-26 LAB — CBC WITH DIFFERENTIAL/PLATELET
Basophils Absolute: 0 10*3/uL (ref 0.0–0.1)
Eosinophils Absolute: 0.2 10*3/uL (ref 0.0–0.7)
Hemoglobin: 14.8 g/dL (ref 12.0–15.0)
Lymphocytes Relative: 32.3 % (ref 12.0–46.0)
Lymphs Abs: 2.4 10*3/uL (ref 0.7–4.0)
MCHC: 33.8 g/dL (ref 30.0–36.0)
Monocytes Relative: 10.8 % (ref 3.0–12.0)
Neutro Abs: 4 10*3/uL (ref 1.4–7.7)
Platelets: 261 10*3/uL (ref 150.0–400.0)
RDW: 14.5 % (ref 11.5–14.6)

## 2012-09-26 LAB — LIPID PANEL
Cholesterol: 189 mg/dL (ref 0–200)
HDL: 64.5 mg/dL (ref >39.00)
VLDL: 35.2 mg/dL (ref 0.0–40.0)

## 2012-09-26 LAB — HEMOGLOBIN A1C: Hgb A1c MFr Bld: 5.8 % (ref 4.6–6.5)

## 2012-09-27 ENCOUNTER — Other Ambulatory Visit: Payer: Medicare Other

## 2012-09-30 ENCOUNTER — Other Ambulatory Visit: Payer: Self-pay | Admitting: Family Medicine

## 2012-09-30 MED ORDER — ZOLPIDEM TARTRATE 10 MG PO TABS: 5.0000 mg | ORAL_TABLET | Freq: Every evening | ORAL | Status: DC | PRN

## 2012-09-30 NOTE — Telephone Encounter (Signed)
Received fax refill request, ok to refill  

## 2012-09-30 NOTE — Telephone Encounter (Signed)
Px written for call in   

## 2012-09-30 NOTE — Telephone Encounter (Signed)
Rx called in as prescribed 

## 2012-10-04 ENCOUNTER — Ambulatory Visit (INDEPENDENT_AMBULATORY_CARE_PROVIDER_SITE_OTHER): Payer: Medicare Other | Admitting: Family Medicine

## 2012-10-04 ENCOUNTER — Encounter: Payer: Self-pay | Admitting: Family Medicine

## 2012-10-04 VITALS — BP 128/78 | HR 79 | Temp 98.4°F | Ht 60.5 in | Wt 145.8 lb

## 2012-10-04 DIAGNOSIS — R7309 Other abnormal glucose: Secondary | ICD-10-CM

## 2012-10-04 DIAGNOSIS — Z0000 Encounter for general adult medical examination without abnormal findings: Secondary | ICD-10-CM

## 2012-10-04 DIAGNOSIS — F17200 Nicotine dependence, unspecified, uncomplicated: Secondary | ICD-10-CM

## 2012-10-04 DIAGNOSIS — E785 Hyperlipidemia, unspecified: Secondary | ICD-10-CM

## 2012-10-04 DIAGNOSIS — M949 Disorder of cartilage, unspecified: Secondary | ICD-10-CM

## 2012-10-04 NOTE — Patient Instructions (Addendum)
Call us if you end up needing a referral for your colonoscopy  Think about working on a living will and power of attorney Good job with weight loss and also cholesterol looks good as well as sugar  Follow up in about 6 months

## 2012-10-04 NOTE — Progress Notes (Signed)
Subjective:    Patient ID: Sandy Harper, female    DOB: [birth year], 72 y.o.   MRN: [id number]  HPI I have personally reviewed the Medicare Annual Wellness questionnaire and have noted 1. The patient's medical and social history 2. Their use of alcohol, tobacco or illicit drugs 3. Their current medications and supplements 4. The patient's functional ability including ADL's, fall risks, home safety risks and hearing or visual             impairment. 5. Diet and physical activities 6. Evidence for depression or mood disorders  The patients weight, height, BMI have been recorded in the chart and visual acuity is per eye clinic.  I have made referrals, counseling and provided education to the patient based review of the above and I have provided the pt with a written personalized care plan for preventive services.  She had back surgery- it did help her pain quite a bit - that is great  Is still getting over the discomfort - getting out of brace soon  See scanned forms.  Routine anticipatory guidance given to patient.  See health maintenance. Flu 10/13 Shingles 4/13 PNA 1/08 Tetanus 6/09  Colon - was in 7/08 - 5 year recall - she got the card from Dr Elliot's office  Breast cancer screening- mammo 5/13 - will schedule her own  No gyn symptoms at all /no problems  Advance directive- does not have living will  Cognitive function addressed- see scanned forms- and if abnormal then additional documentation follows.  No worries about significant memory problems at all   PMH and SH reviewed  Meds, vitals, and allergies reviewed.   ROS: See HPI.  Otherwise negative.    Labs  Lab Results  Component Value Date   CHOL 189 09/26/2012   CHOL 204* 03/26/2012   CHOL 201* 08/29/2011   Lab Results  Component Value Date   HDL 64.50 09/26/2012   HDL 59.60 03/26/2012   HDL 64.80 08/29/2011   Lab Results  Component Value Date   LDLCALC 89 09/26/2012   LDLCALC 100* 09/13/2010   LDLCALC 119* 10/01/2007    Lab Results  Component Value Date   TRIG 176.0* 09/26/2012   TRIG 149.0 03/26/2012   TRIG 258.0* 08/29/2011   Lab Results  Component Value Date   CHOLHDL 3 09/26/2012   CHOLHDL 3 03/26/2012   CHOLHDL 3 08/29/2011   Lab Results  Component Value Date   LDLDIRECT 118.4 03/26/2012   LDLDIRECT 109.3 08/29/2011   LDLDIRECT 120.1 02/09/2009   good HDL- that has improved her ratio  Patient Active Problem List   Diagnosis Date Noted  . Encounter for Medicare annual wellness exam 09/24/2012  . Leg cramps 08/28/2011  . Foot pain 08/28/2011  . Bruising 08/28/2011  . Other screening mammogram 09/19/2010  . HEMORRHOIDS, EXTERNAL 12/30/2009  . VAGINITIS 08/23/2009  . OVARIAN CYST 08/23/2009  . ARTHRALGIA 06/22/2009  . MYALGIA 06/22/2009  . BACK PAIN, THORACIC REGION, RIGHT 03/03/2009  . UNSPECIFIED VITAMIN D DEFICIENCY 11/12/2007  . HYPERLIPIDEMIA, BORDERLINE 10/01/2007  . DEPRESSION 10/01/2007  . GERD 10/01/2007  . HYPERGLYCEMIA 10/01/2007  . CALCULUS, URINARY NOS 02/26/2007  . TOBACCO ABUSE 01/14/2007  . FIBROCYSTIC BREAST DISEASE 01/14/2007  . VITILIGO 01/14/2007  . DEGENERATIVE JOINT DISEASE 01/14/2007  . OSTEOPENIA 01/14/2007  . SLEEP DISORDER 01/14/2007  . INSOMNIA 01/14/2007  . MURMUR 01/14/2007   Past Medical History  Diagnosis Date  . Osteopenia   . Sleep disorder   . Kidney stones  With lithotripsy and stent  . Hyperlipidemia   . Hyperglycemia   . Depression   . Vitiligo   . Tobacco abuse   . Urine WBC increased 2005    Urology work-normal  . Heart murmur     echocardiogram- 10 yrs. ago- wnl  . Heart palpitations 2001    due to stress - pt. feels that it has resolved & it was related to stress at that time  . Degenerative joint disease     lumbar region & L hand - cortisone injection in past    Past Surgical History  Procedure Laterality Date  . Carpal tunnel release      bilateral  . Hemorrhoid surgery    . Oophorectomy      Left cyst  . Breast biopsy       X 2 left, benign  . Trigger finger release  2003    Right hand  . Hand surgery  02/10  . Back surgery  02/11    1995- decompression, then followed by fusion in 2011  . Tubal ligation     History  Substance Use Topics  . Smoking status: Current Every Day Smoker -- 0.25 packs/day  . Smokeless tobacco: Not on file     Comment: Off and on  . Alcohol Use: Yes     Comment: social/ wine- once q two weeks    Family History  Problem Relation Age of Onset  . Heart disease Father     MI  . Cancer Sister     uterine cancer  . Cancer Brother     colon  . Diabetes Brother   . Heart disease Brother     CAD  . Diabetes Brother   . Cancer Brother     prostate  . Diabetes Brother    Allergies  Allergen Reactions  . Alendronate Sodium Other (See Comments)    reflux  . Bupropion Hcl Palpitations     heart palpitations   Current Outpatient Prescriptions on File Prior to Visit  Medication Sig Dispense Refill  . cholecalciferol (VITAMIN D) 1000 UNITS tablet Take 1,000 Units by mouth 3 (three) times daily.      Tery Sanfilippo Calcium (STOOL SOFTENER PO) Take 1-2 capsules by mouth 2 (two) times daily as needed (Constipation).      . gabapentin (NEURONTIN) 300 MG capsule Take 300 mg by mouth 3 (three) times daily.      Marland Kitchen HYDROcodone-acetaminophen (NORCO/VICODIN) 5-325 MG per tablet Take 1 tablet by mouth 2 (two) times daily as needed for pain.       . nabumetone (RELAFEN) 500 MG tablet Take 500 mg by mouth 2 (two) times daily.      Marland Kitchen zolpidem (AMBIEN) 10 MG tablet Take 0.5 tablets (5 mg total) by mouth at bedtime as needed for sleep.  30 tablet  3   No current facility-administered medications on file prior to visit.    Review of Systems Review of Systems  Constitutional: Negative for fever, appetite change, fatigue and unexpected weight change.  Eyes: Negative for pain and visual disturbance.  Respiratory: Negative for cough and shortness of breath.   Cardiovascular: Negative for cp  or palpitations    Gastrointestinal: Negative for nausea, diarrhea and constipation.  Genitourinary: Negative for urgency and frequency.  Skin: Negative for pallor or rash   MSK pos for back pain that is improving post surgery Neurological: Negative for weakness, light-headedness, numbness and headaches.  Hematological: Negative for adenopathy. Does not bruise/bleed easily.  Psychiatric/Behavioral: Negative for dysphoric mood. The patient is not nervous/anxious.         Objective:   Physical Exam  Constitutional: She appears well-developed and well-nourished. No distress.  HENT:  Head: Normocephalic and atraumatic.  Mouth/Throat: Oropharynx is clear and moist.  Eyes: Conjunctivae and EOM are normal. Pupils are equal, round, and reactive to light. Right eye exhibits no discharge. Left eye exhibits no discharge. No scleral icterus.  Neck: Normal range of motion. Neck supple. No JVD present. Carotid bruit is not present. No thyromegaly present.  Cardiovascular: Normal rate, regular rhythm and intact distal pulses.   Murmur heard. Pulmonary/Chest: Effort normal and breath sounds normal. No respiratory distress. She has no wheezes.  Abdominal: Soft. Bowel sounds are normal. She exhibits no distension, no abdominal bruit and no mass. There is no tenderness.  Genitourinary: No breast swelling, tenderness, discharge or bleeding.  Breast exam: No mass, nodules, thickening, tenderness, bulging, retraction, inflamation, nipple discharge or skin changes noted.  No axillary or clavicular LA.  Chaperoned exam.    Musculoskeletal: She exhibits no edema and no tenderness.  Lymphadenopathy:    She has no cervical adenopathy.  Neurological: She is alert. She has normal reflexes. No cranial nerve deficit. She exhibits normal muscle tone. Coordination normal.  Skin: Skin is warm and dry. No rash noted. No erythema. No pallor.  Psychiatric: She has a normal mood and affect.          Assessment &  Plan:

## 2012-10-06 NOTE — Assessment & Plan Note (Signed)
Lab Results  Component Value Date   HGBA1C 5.8 09/26/2012   this is stable  Disc continued work on diet and exercise

## 2012-10-06 NOTE — Assessment & Plan Note (Signed)
Counseled on smoking cessation/ intake of ca and D and exercise No falls or fx She declines further dexa at this time  Intol of fosamax in the past

## 2012-10-06 NOTE — Assessment & Plan Note (Signed)
Reviewed health habits including diet and exercise and skin cancer prevention Also reviewed health mt list, fam hx and immunizations  See HPI  Wellness labs reviewed in detail

## 2012-10-06 NOTE — Assessment & Plan Note (Signed)
Disc in detail risks of smoking and possible outcomes including copd, vascular/ heart disease, cancer , respiratory and sinus infections  Pt voices understanding  She is not ready to quit yet but thinking about it

## 2012-12-23 ENCOUNTER — Ambulatory Visit: Payer: Self-pay | Admitting: Family Medicine

## 2012-12-24 ENCOUNTER — Encounter: Payer: Self-pay | Admitting: Family Medicine

## 2013-01-01 ENCOUNTER — Other Ambulatory Visit: Payer: Self-pay

## 2013-04-03 ENCOUNTER — Other Ambulatory Visit: Payer: Self-pay

## 2013-04-05 ENCOUNTER — Emergency Department: Payer: Self-pay | Admitting: Internal Medicine

## 2013-04-05 LAB — COMPREHENSIVE METABOLIC PANEL
Albumin: 3.7 g/dL (ref 3.4–5.0)
Alkaline Phosphatase: 102 U/L (ref 50–136)
Anion Gap: 4 — ABNORMAL LOW (ref 7–16)
BUN: 15 mg/dL (ref 7–18)
Bilirubin,Total: 0.8 mg/dL (ref 0.2–1.0)
Calcium, Total: 8.9 mg/dL (ref 8.5–10.1)
Co2: 27 mmol/L (ref 21–32)
Creatinine: 0.73 mg/dL (ref 0.60–1.30)
Glucose: 141 mg/dL — ABNORMAL HIGH (ref 65–99)
Osmolality: 277 (ref 275–301)
Potassium: 4.1 mmol/L (ref 3.5–5.1)
Sodium: 137 mmol/L (ref 136–145)
Total Protein: 7.1 g/dL (ref 6.4–8.2)

## 2013-04-05 LAB — URINALYSIS, COMPLETE
Bacteria: NONE SEEN
Bilirubin,UR: NEGATIVE
Ketone: NEGATIVE
Nitrite: NEGATIVE
Ph: 6 (ref 4.5–8.0)
RBC,UR: 4 /HPF (ref 0–5)
Specific Gravity: 1.006 (ref 1.003–1.030)
WBC UR: 54 /HPF (ref 0–5)

## 2013-04-05 LAB — CBC
HCT: 42.5 % (ref 35.0–47.0)
MCHC: 34.5 g/dL (ref 32.0–36.0)
RBC: 4.46 10*6/uL (ref 3.80–5.20)
RDW: 14.2 % (ref 11.5–14.5)
WBC: 9 10*3/uL (ref 3.6–11.0)

## 2013-04-05 LAB — LIPASE, BLOOD: Lipase: 162 U/L (ref 73–393)

## 2013-04-08 ENCOUNTER — Ambulatory Visit (INDEPENDENT_AMBULATORY_CARE_PROVIDER_SITE_OTHER): Payer: Medicare Other | Admitting: Family Medicine

## 2013-04-08 ENCOUNTER — Encounter: Payer: Self-pay | Admitting: Family Medicine

## 2013-04-08 VITALS — BP 142/86 | HR 84 | Temp 98.5°F | Ht 60.5 in | Wt 151.5 lb

## 2013-04-08 DIAGNOSIS — F17200 Nicotine dependence, unspecified, uncomplicated: Secondary | ICD-10-CM

## 2013-04-08 DIAGNOSIS — K5732 Diverticulitis of large intestine without perforation or abscess without bleeding: Secondary | ICD-10-CM

## 2013-04-08 DIAGNOSIS — K5792 Diverticulitis of intestine, part unspecified, without perforation or abscess without bleeding: Secondary | ICD-10-CM

## 2013-04-08 DIAGNOSIS — G629 Polyneuropathy, unspecified: Secondary | ICD-10-CM | POA: Insufficient documentation

## 2013-04-08 DIAGNOSIS — G609 Hereditary and idiopathic neuropathy, unspecified: Secondary | ICD-10-CM

## 2013-04-08 MED ORDER — GABAPENTIN 300 MG PO CAPS: 600.0000 mg | ORAL_CAPSULE | Freq: Three times a day (TID) | ORAL | Status: DC

## 2013-04-08 MED ORDER — ZOLPIDEM TARTRATE 10 MG PO TABS: 5.0000 mg | ORAL_TABLET | Freq: Every evening | ORAL | Status: DC | PRN

## 2013-04-08 NOTE — Progress Notes (Signed)
Subjective:    Patient ID: Sandy Harper, female    DOB: June 21, 1940, 72 y.o.   MRN: [id number]  HPI Here for ER f/u  [facility] on 11/8- abdominal pain --LLQ (pretty bad)  Dx with diverticulitis - by CT--no abscess and did have stones in kidneys  Poss adrenal adenomas  Labs ok  ua had wbc tx with cipro and flagyl Did not have vomiting and fever - just a little nausea   Thinks she may have had this before   Has a colonoscopy scheduled for Jan   She  Did have her flu vaccine   Also still having a lot of problems with neuropathy in feet No  Cause  She gets gabapentin for her back which helps a little  Very symptomatic Lab Results  Component Value Date   HGBA1C 5.8 09/26/2012    Is on premarin cream for vaginal atrophy- need to add to med list Is helping   She still smokes Not ready to quit  Has a lot of stress    Patient Active Problem List   Diagnosis Date Noted  . Encounter for Medicare annual wellness exam 09/24/2012  . Leg cramps 08/28/2011  . Other screening mammogram 09/19/2010  . HEMORRHOIDS, EXTERNAL 12/30/2009  . OVARIAN CYST 08/23/2009  . BACK PAIN, THORACIC REGION, RIGHT 03/03/2009  . UNSPECIFIED VITAMIN D DEFICIENCY 11/12/2007  . DEPRESSION 10/01/2007  . GERD 10/01/2007  . HYPERGLYCEMIA 10/01/2007  . CALCULUS, URINARY NOS 02/26/2007  . TOBACCO ABUSE 01/14/2007  . FIBROCYSTIC BREAST DISEASE 01/14/2007  . VITILIGO 01/14/2007  . DEGENERATIVE JOINT DISEASE 01/14/2007  . OSTEOPENIA 01/14/2007  . SLEEP DISORDER 01/14/2007  . INSOMNIA 01/14/2007  . MURMUR 01/14/2007   Past Medical History  Diagnosis Date  . Osteopenia   . Sleep disorder   . Kidney stones     With lithotripsy and stent  . Hyperlipidemia   . Hyperglycemia   . Depression   . Vitiligo   . Tobacco abuse   . Urine WBC increased 2005    Urology work-normal  . Heart murmur     echocardiogram- 10 yrs. ago- wnl  . Heart palpitations 2001    due to stress - pt. feels that it has resolved & it  was related to stress at that time  . Degenerative joint disease     lumbar region & L hand - cortisone injection in past    Past Surgical History  Procedure Laterality Date  . Carpal tunnel release      bilateral  . Hemorrhoid surgery    . Oophorectomy      Left cyst  . Breast biopsy      X 2 left, benign  . Trigger finger release  2003    Right hand  . Hand surgery  02/10  . Back surgery  02/11    1995- decompression, then followed by fusion in 2011  . Tubal ligation     History  Substance Use Topics  . Smoking status: Current Every Day Smoker -- 0.10 packs/day  . Smokeless tobacco: Not on file     Comment: Off and on (uses E-cigarettes  . Alcohol Use: Yes     Comment: social/ wine- once q two weeks    Family History  Problem Relation Age of Onset  . Heart disease Father     MI  . Cancer Sister     uterine cancer  . Cancer Brother     colon  . Diabetes Brother   .  Heart disease Brother     CAD  . Diabetes Brother   . Cancer Brother     prostate  . Diabetes Brother    Allergies  Allergen Reactions  . Alendronate Sodium Other (See Comments)    reflux  . Bupropion Hcl Palpitations     heart palpitations   Current Outpatient Prescriptions on File Prior to Visit  Medication Sig Dispense Refill  . cholecalciferol (VITAMIN D) 1000 UNITS tablet Take 1,000 Units by mouth 3 (three) times daily.      Tery Sanfilippo Calcium (STOOL SOFTENER PO) Take 1-2 capsules by mouth 2 (two) times daily as needed (Constipation).      . gabapentin (NEURONTIN) 300 MG capsule Take 300 mg by mouth 3 (three) times daily.      Marland Kitchen HYDROcodone-acetaminophen (NORCO/VICODIN) 5-325 MG per tablet Take 1 tablet by mouth 2 (two) times daily as needed for pain.       Marland Kitchen zolpidem (AMBIEN) 10 MG tablet Take 0.5 tablets (5 mg total) by mouth at bedtime as needed for sleep.  30 tablet  3   No current facility-administered medications on file prior to visit.    Review of Systems Review of Systems   Constitutional: Negative for fever, appetite change, fatigue and unexpected weight change.  Eyes: Negative for pain and visual disturbance.  Respiratory: Negative for cough and shortness of breath.   Cardiovascular: Negative for cp or palpitations    Gastrointestinal: Negative for nausea, diarrhea and constipation. neg for blood in stool Genitourinary: Negative for urgency and frequency.  Skin: Negative for pallor or rash   MSK- chronic pain in back and feet  Neurological: Negative for weakness, light-headedness, numbness and headaches. pos for burning pain in her feet  Hematological: Negative for adenopathy. Does not bruise/bleed easily.  Psychiatric/Behavioral: Negative for dysphoric mood. The patient is not nervous/anxious.         Objective:   Physical Exam  Constitutional: She appears well-developed and well-nourished. No distress.  HENT:  Head: Normocephalic and atraumatic.  Right Ear: External ear normal.  Left Ear: External ear normal.  Nose: Nose normal.  Mouth/Throat: Oropharynx is clear and moist.  Eyes: Conjunctivae and EOM are normal. Pupils are equal, round, and reactive to light. Right eye exhibits no discharge. Left eye exhibits no discharge. No scleral icterus.  Neck: Normal range of motion. Neck supple. No JVD present. No thyromegaly present.  Cardiovascular: Normal rate, regular rhythm and intact distal pulses.  Exam reveals no gallop.   Murmur heard. Pulmonary/Chest: Effort normal and breath sounds normal. No respiratory distress. She has no wheezes. She has no rales.  Abdominal: Soft. Bowel sounds are normal. She exhibits no distension and no mass. There is no tenderness.  Non tender today  Musculoskeletal: She exhibits no edema and no tenderness.  Lymphadenopathy:    She has no cervical adenopathy.  Neurological: She is alert. She has normal reflexes. No cranial nerve deficit. She exhibits normal muscle tone. Coordination normal.  Nl sens to soft touch in  feet   Skin: Skin is warm and dry. No rash noted. No erythema. No pallor.  Psychiatric: She has a normal mood and affect.  Pt seems frustated by her health problems          Assessment & Plan:

## 2013-04-08 NOTE — Patient Instructions (Signed)
I'm glad the diverticulitis is improving - finish the antibiotic Consider citrucel or metamucil once daily  Also high fiber diet and lots of water Avoid nuts/seeds/ popcorn Get colonoscopy in Jan as planned For neuropathy- we can gradually move up with dose of gabapentin  Week 1: take 300 am 300 mid day and 600 pm Week 2 take 300 am 600 mid day and 600 pm If doing well and you want to advance further week 3 : 600 mg three times per day  See how you do  If not successful we will refer you to a general neurologist  Take care of yourself

## 2013-04-08 NOTE — Progress Notes (Signed)
Pre-visit discussion using our clinic review tool. No additional management support is needed unless otherwise documented below in the visit note.  

## 2013-04-09 NOTE — Assessment & Plan Note (Signed)
Will inc her gabapentin gradually to 300 mg tid - see inst - tx to improvement  Disc foot care If no improvement -offered neuro f/u

## 2013-04-09 NOTE — Assessment & Plan Note (Signed)
Disc in detail risks of smoking and possible outcomes including copd, vascular/ heart disease, cancer , respiratory and sinus infections  Pt voices understanding She is not ready to quit  

## 2013-04-09 NOTE — Assessment & Plan Note (Signed)
Improving on cipro and flagyl  Will finish abx  Diet rev and given handout colonosc planned for Jan Update if not starting to improve in a week or if worsening  e

## 2013-05-16 ENCOUNTER — Telehealth: Payer: Self-pay | Admitting: Family Medicine

## 2013-05-16 NOTE — Telephone Encounter (Signed)
Patient Information:  Caller Name: Sandy Harper  Phone: [phone]  Patient: Sandy Harper  Gender: Female  DOB: [birth year]  Age: 72 Years  PCP: Tower, [employer] [facility])  Office Follow Up:  Does the office need to follow up with this patient?: No  Instructions For The Office: N/A   Symptoms  Reason For Call & Symptoms: 04/08/13 she saw Dr. Milinda Antis for a F/U and DX with Diverticulitis.  She was on Flagyl and Cipro.  05/15/13 she started feeling pain again.  Reviewed Health History In EMR: Yes  Reviewed Medications In EMR: Yes  Reviewed Allergies In EMR: Yes  Reviewed Surgeries / Procedures: Yes  Date of Onset of Symptoms: 05/15/2013  Guideline(s) Used:  Abdominal Pain - Female  Disposition Per Guideline:   See Today in Office  Reason For Disposition Reached:   Moderate or mild pain that comes and goes (cramps) lasts > 24 hours  Advice Given:  N/A  Patient Refused Recommendation:  Patient Will Follow Up With Office Later  Or go to the ED if the pain gets bad enough.

## 2013-05-16 NOTE — Telephone Encounter (Signed)
Patient Information: ° Caller Name: Lura ° Phone: [phone] ° Patient: Sandy Harper, Sandy Harper ° Gender: Female ° DOB: 01/16/1941 ° Age: 72 Years ° PCP: Tower, [place] (Family Practice) ° °Office Follow Up: ° Does the office need to follow up with this patient?: No ° Instructions For The Office: N/A ° ° °Symptoms ° Reason For Call & Symptoms: 04/08/13 she saw Dr. Tower for a F/U and DX with Diverticulitis.  She was on Flagyl and Cipro.  05/15/13 she started feeling pain again. ° Reviewed Health History In EMR: Yes ° Reviewed Medications In EMR: Yes ° Reviewed Allergies In EMR: Yes ° Reviewed Surgeries / Procedures: Yes ° Date of Onset of Symptoms: 05/15/2013 ° °Guideline(s) Used: ° Abdominal Pain - Female ° °Disposition Per Guideline:  ° See Today in Office ° °Reason For Disposition Reached:  ° Moderate or mild pain that comes and goes (cramps) lasts > 24 hours ° °Advice Given: ° N/A ° °Patient Refused Recommendation: ° Patient Will Follow Up With Office Later ° Or go to the ED if the pain gets bad enough. ° ° °

## 2013-05-19 NOTE — Telephone Encounter (Signed)
Aware - she declined f/u

## 2013-06-30 ENCOUNTER — Encounter: Payer: Self-pay | Admitting: Family Medicine

## 2013-06-30 ENCOUNTER — Ambulatory Visit (INDEPENDENT_AMBULATORY_CARE_PROVIDER_SITE_OTHER): Payer: Medicare Other | Admitting: Family Medicine

## 2013-06-30 VITALS — BP 120/60 | HR 79 | Temp 98.2°F | Ht 60.5 in | Wt 153.5 lb

## 2013-06-30 DIAGNOSIS — Z87898 Personal history of other specified conditions: Secondary | ICD-10-CM

## 2013-06-30 DIAGNOSIS — K5730 Diverticulosis of large intestine without perforation or abscess without bleeding: Secondary | ICD-10-CM

## 2013-06-30 DIAGNOSIS — K5790 Diverticulosis of intestine, part unspecified, without perforation or abscess without bleeding: Secondary | ICD-10-CM | POA: Insufficient documentation

## 2013-06-30 MED ORDER — CIPROFLOXACIN HCL 500 MG PO TABS: 500.0000 mg | ORAL_TABLET | Freq: Two times a day (BID) | ORAL | Status: DC

## 2013-06-30 MED ORDER — SCOPOLAMINE 1 MG/3DAYS TD PT72: 1.0000 | MEDICATED_PATCH | TRANSDERMAL | Status: DC

## 2013-06-30 MED ORDER — METRONIDAZOLE 500 MG PO TABS: 500.0000 mg | ORAL_TABLET | Freq: Three times a day (TID) | ORAL | Status: DC

## 2013-06-30 NOTE — Progress Notes (Signed)
Pre-visit discussion using our clinic review tool. No additional management support is needed unless otherwise documented below in the visit note.  

## 2013-06-30 NOTE — Patient Instructions (Signed)
Take the cipro and flagyl with you to [place] - in case you develop any symptoms of diverticulitis Use the scopalamine patch for boat travel if needed  Take care of yourself  Try a fiber supplement daily like citrucel

## 2013-06-30 NOTE — Assessment & Plan Note (Signed)
For upcoming trip with boat ride/ deep sea fishing -given px for scopalamine patches to use prn Disc poss side eff incl sedation/ dry mouth

## 2013-06-30 NOTE — Assessment & Plan Note (Signed)
Disc nature of this and ways to prevent infection/ diverticulitis  Will try a daily fiber supplement like citrucel  Also continue avoidance of nuts/ seeds/popcorn or other foods that bother her  Given px for cipro/ flagyl to take on trip to [place] in case she needs them (will also seek medical care if needed)  Has colonoscopy upcoming

## 2013-06-30 NOTE — Progress Notes (Signed)
Subjective:    Patient ID: Sandy Harper, female    DOB: 02/22/1941, 73 y.o.   MRN: [id number]  HPI Here with questions about diverticular dz   She avoids nuts and seeds  Has regular bowel habits occ stool softeners  Drinks water   She has had 2 episodes - of moderate to severe pain - will go away on its own after a few days  No diverticulitis/ infection since last visit   Is going to [place] - for a trip and wants to take antibiotics with her just in case She avoids nuts and seeds entirely Does not take fiber   Will be deep sea fishing  Will need scop patch for motion sickness   Patient Active Problem List   Diagnosis Date Noted  . Diverticulitis 04/08/2013  . Peripheral neuropathy 04/08/2013  . Encounter for Medicare annual wellness exam 09/24/2012  . Leg cramps 08/28/2011  . Other screening mammogram 09/19/2010  . HEMORRHOIDS, EXTERNAL 12/30/2009  . OVARIAN CYST 08/23/2009  . BACK PAIN, THORACIC REGION, RIGHT 03/03/2009  . UNSPECIFIED VITAMIN D DEFICIENCY 11/12/2007  . DEPRESSION 10/01/2007  . GERD 10/01/2007  . HYPERGLYCEMIA 10/01/2007  . CALCULUS, URINARY NOS 02/26/2007  . TOBACCO ABUSE 01/14/2007  . FIBROCYSTIC BREAST DISEASE 01/14/2007  . VITILIGO 01/14/2007  . DEGENERATIVE JOINT DISEASE 01/14/2007  . OSTEOPENIA 01/14/2007  . SLEEP DISORDER 01/14/2007  . INSOMNIA 01/14/2007  . MURMUR 01/14/2007   Past Medical History  Diagnosis Date  . Osteopenia   . Sleep disorder   . Kidney stones     With lithotripsy and stent  . Hyperlipidemia   . Hyperglycemia   . Depression   . Vitiligo   . Tobacco abuse   . Urine WBC increased 2005    Urology work-normal  . Heart murmur     echocardiogram- 10 yrs. ago- wnl  . Heart palpitations 2001    due to stress - pt. feels that it has resolved & it was related to stress at that time  . Degenerative joint disease     lumbar region & L hand - cortisone injection in past    Past Surgical History  Procedure  Laterality Date  . Carpal tunnel release      bilateral  . Hemorrhoid surgery    . Oophorectomy      Left cyst  . Breast biopsy      X 2 left, benign  . Trigger finger release  2003    Right hand  . Hand surgery  02/10  . Back surgery  02/11    1995- decompression, then followed by fusion in 2011  . Tubal ligation     History  Substance Use Topics  . Smoking status: Light Tobacco Smoker  . Smokeless tobacco: Not on file     Comment: only smokes E-cigarettes  . Alcohol Use: Yes     Comment: social/ wine- once q two weeks    Family History  Problem Relation Age of Onset  . Heart disease Father     MI  . Cancer Sister     uterine cancer  . Cancer Brother     colon  . Diabetes Brother   . Heart disease Brother     CAD  . Diabetes Brother   . Cancer Brother     prostate  . Diabetes Brother    Allergies  Allergen Reactions  . Alendronate Sodium Other (See Comments)    reflux  . Bupropion Hcl Palpitations  heart palpitations   Current Outpatient Prescriptions on File Prior to Visit  Medication Sig Dispense Refill  . cholecalciferol (VITAMIN D) 1000 UNITS tablet Take 1,000 Units by mouth 3 (three) times daily.      Marland Kitchen conjugated estrogens (PREMARIN) vaginal cream Place 1 Applicatorful vaginally 2 (two) times a week.      Tery Sanfilippo Calcium (STOOL SOFTENER PO) Take 1-2 capsules by mouth 2 (two) times daily as needed (Constipation).      . gabapentin (NEURONTIN) 300 MG capsule Take 2 capsules (600 mg total) by mouth 3 (three) times daily.  180 capsule  3  . meloxicam (MOBIC) 15 MG tablet Take 1 tablet by mouth daily.      Marland Kitchen zolpidem (AMBIEN) 10 MG tablet Take 0.5 tablets (5 mg total) by mouth at bedtime as needed for sleep.  30 tablet  3  . HYDROcodone-acetaminophen (NORCO/VICODIN) 5-325 MG per tablet Take 1 tablet by mouth 2 (two) times daily as needed for pain.        No current facility-administered medications on file prior to visit.      Review of Systems      Objective:   Physical Exam  Constitutional: She appears well-developed and well-nourished. No distress.  obese and well appearing   HENT:  Head: Normocephalic and atraumatic.  Mouth/Throat: Oropharynx is clear and moist.  Eyes: Conjunctivae and EOM are normal. Pupils are equal, round, and reactive to light. No scleral icterus.  Neck: Normal range of motion. Neck supple. No JVD present. Carotid bruit is not present.  Abdominal: Soft. Normal appearance and bowel sounds are normal. She exhibits no abdominal bruit and no mass. There is no hepatosplenomegaly. There is no tenderness. There is no guarding.  Musculoskeletal: She exhibits no edema.  Lymphadenopathy:    She has no cervical adenopathy.  Neurological: She is alert. She has normal reflexes.  Skin: Skin is warm and dry. No rash noted. No erythema. No pallor.  Psychiatric: She has a normal mood and affect.          Assessment & Plan:

## 2013-07-02 ENCOUNTER — Telehealth: Payer: Self-pay | Admitting: Family Medicine

## 2013-07-02 NOTE — Telephone Encounter (Signed)
Relevant patient education assigned to patient using Emmi. ° °

## 2013-07-04 ENCOUNTER — Ambulatory Visit (INDEPENDENT_AMBULATORY_CARE_PROVIDER_SITE_OTHER): Payer: Medicare Other

## 2013-07-04 ENCOUNTER — Encounter: Payer: Self-pay | Admitting: Podiatry

## 2013-07-04 ENCOUNTER — Ambulatory Visit (INDEPENDENT_AMBULATORY_CARE_PROVIDER_SITE_OTHER): Payer: Medicare Other | Admitting: Podiatry

## 2013-07-04 VITALS — BP 131/60 | HR 91 | Resp 16 | Ht 62.0 in | Wt 153.0 lb

## 2013-07-04 DIAGNOSIS — M79609 Pain in unspecified limb: Secondary | ICD-10-CM

## 2013-07-04 DIAGNOSIS — M79673 Pain in unspecified foot: Secondary | ICD-10-CM

## 2013-07-04 DIAGNOSIS — M7750 Other enthesopathy of unspecified foot: Secondary | ICD-10-CM

## 2013-07-04 DIAGNOSIS — L03039 Cellulitis of unspecified toe: Secondary | ICD-10-CM

## 2013-07-04 DIAGNOSIS — G5790 Unspecified mononeuropathy of unspecified lower limb: Secondary | ICD-10-CM

## 2013-07-04 NOTE — Patient Instructions (Signed)

## 2013-07-04 NOTE — Progress Notes (Signed)
Subjective:     Patient ID: Sandy Harper, female   DOB: 02/04/1941, 73 y.o.   MRN: [id number]  HPI patient points to left second toe medial side stating it's been red and irritated and she is due to go on a trip in less than a week and she is worried about infection  Review of Systems     Objective:   Physical Exam Neurovascular status intact with inflamed left second toe medial border with a small amount of distal drainage    Assessment:     Paronychia left second toe with pain    Plan:     Reviewed condition and recommended correction. Debridement the area and went ahead and infiltrated 60 mg Xylocaine Marcaine mixture. Using sterile instruments are removed the medial border removed proud flesh abscess tissue and allowed channel for drainage. Discussed her feet in general reviewing x-rays and recommended continued gabapentin treatment with no other remedies that I can tell

## 2013-07-04 NOTE — Progress Notes (Signed)
   Subjective:    Patient ID: Idalia NeedleJoan C Volcy, female    DOB: 02/18/1941, 73 y.o.   MRN: [id number]  HPI Comments: N pain L 2nd toe left foot D 1 week O sudden C worse A touching toe, shoe, and bed covers T neosporin    N NUMB, HURT, COLD L B/L FEET D 2-3 YEARS O SLOWLY C WORSE A 0 T GABAPENTIN  Toe Pain       Review of Systems  Constitutional: Negative.   HENT: Positive for sore throat.   Eyes: Negative.   Respiratory: Positive for cough.   Cardiovascular: Negative.   Gastrointestinal: Negative.   Endocrine: Negative.   Genitourinary: Negative.   Musculoskeletal: Negative.   Skin: Negative.   Allergic/Immunologic: Negative.   Neurological: Negative.   Hematological: Negative.   Psychiatric/Behavioral: Negative.        Objective:   Physical Exam        Assessment & Plan:

## 2013-07-31 ENCOUNTER — Ambulatory Visit: Payer: Self-pay | Admitting: Unknown Physician Specialty

## 2013-10-16 ENCOUNTER — Other Ambulatory Visit: Payer: Self-pay | Admitting: *Deleted

## 2013-10-16 MED ORDER — GABAPENTIN 300 MG PO CAPS: 600.0000 mg | ORAL_CAPSULE | Freq: Three times a day (TID) | ORAL | Status: DC

## 2013-10-16 MED ORDER — ZOLPIDEM TARTRATE 10 MG PO TABS: 5.0000 mg | ORAL_TABLET | Freq: Every evening | ORAL | Status: DC | PRN

## 2013-10-16 NOTE — Telephone Encounter (Signed)
Px written for call in   

## 2013-10-16 NOTE — Telephone Encounter (Signed)
Fax refill request, please advise  

## 2013-10-17 NOTE — Telephone Encounter (Signed)
Rx called in to requested pharmacy 

## 2013-12-08 ENCOUNTER — Other Ambulatory Visit: Payer: Self-pay | Admitting: Family Medicine

## 2013-12-08 NOTE — Telephone Encounter (Signed)
Will refill electronically  

## 2013-12-08 NOTE — Telephone Encounter (Signed)
Electronic refill request, please advise  

## 2013-12-15 ENCOUNTER — Telehealth: Payer: Self-pay | Admitting: Family Medicine

## 2013-12-15 DIAGNOSIS — G629 Polyneuropathy, unspecified: Secondary | ICD-10-CM

## 2013-12-15 DIAGNOSIS — R7309 Other abnormal glucose: Secondary | ICD-10-CM

## 2013-12-15 NOTE — Telephone Encounter (Signed)
Pt's feet are still hurting very badly.  She has an appt to see you on Monday 12/22/13.  She wants to know if you want her to get the A1c test this week to have the results by next Monday when she comes.   This has been going on so long and the Gabapintin helps but it doesn't stop the problem and she feels like something is wrong somewhere.  She also wants you to know she is going to her back doctor in September.

## 2013-12-15 NOTE — Telephone Encounter (Signed)
Thanks -I went ahead and put a lab order in so she can have that done before her visit  Thanks for the update

## 2013-12-16 NOTE — Telephone Encounter (Signed)
Left voicemail requesting pt to call office back and schedule a lab appt

## 2013-12-17 ENCOUNTER — Other Ambulatory Visit (INDEPENDENT_AMBULATORY_CARE_PROVIDER_SITE_OTHER): Payer: Medicare Other

## 2013-12-17 DIAGNOSIS — G609 Hereditary and idiopathic neuropathy, unspecified: Secondary | ICD-10-CM

## 2013-12-17 DIAGNOSIS — G629 Polyneuropathy, unspecified: Secondary | ICD-10-CM

## 2013-12-17 DIAGNOSIS — R7309 Other abnormal glucose: Secondary | ICD-10-CM

## 2013-12-17 LAB — COMPREHENSIVE METABOLIC PANEL
ALK PHOS: 75 U/L (ref 39–117)
ALT: 13 U/L (ref 0–35)
AST: 21 U/L (ref 0–37)
Albumin: 4.1 g/dL (ref 3.5–5.2)
BILIRUBIN TOTAL: 0.6 mg/dL (ref 0.2–1.2)
BUN: 19 mg/dL (ref 6–23)
CO2: 27 mEq/L (ref 19–32)
Calcium: 9.2 mg/dL (ref 8.4–10.5)
Chloride: 105 mEq/L (ref 96–112)
Creatinine, Ser: 0.7 mg/dL (ref 0.4–1.2)
GFR: 93.45 mL/min (ref >60.00)
Glucose, Bld: 157 mg/dL — ABNORMAL HIGH (ref 70–99)
Potassium: 4.8 mEq/L (ref 3.5–5.1)
Sodium: 140 mEq/L (ref 135–145)
Total Protein: 7 g/dL (ref 6.0–8.3)

## 2013-12-17 LAB — HEMOGLOBIN A1C: Hgb A1c MFr Bld: 5.9 % (ref 4.6–6.5)

## 2013-12-22 ENCOUNTER — Encounter: Payer: Self-pay | Admitting: Family Medicine

## 2013-12-22 ENCOUNTER — Ambulatory Visit (INDEPENDENT_AMBULATORY_CARE_PROVIDER_SITE_OTHER): Payer: Medicare Other | Admitting: Family Medicine

## 2013-12-22 VITALS — BP 136/80 | HR 68 | Temp 98.0°F | Ht 62.0 in | Wt 156.5 lb

## 2013-12-22 DIAGNOSIS — M949 Disorder of cartilage, unspecified: Secondary | ICD-10-CM

## 2013-12-22 DIAGNOSIS — E559 Vitamin D deficiency, unspecified: Secondary | ICD-10-CM

## 2013-12-22 DIAGNOSIS — M899 Disorder of bone, unspecified: Secondary | ICD-10-CM

## 2013-12-22 DIAGNOSIS — G609 Hereditary and idiopathic neuropathy, unspecified: Secondary | ICD-10-CM

## 2013-12-22 DIAGNOSIS — G629 Polyneuropathy, unspecified: Secondary | ICD-10-CM

## 2013-12-22 DIAGNOSIS — R7309 Other abnormal glucose: Secondary | ICD-10-CM

## 2013-12-22 LAB — TSH: TSH: 2.21 u[IU]/mL (ref 0.35–4.50)

## 2013-12-22 LAB — VITAMIN B12: VITAMIN B 12: 223 pg/mL (ref 211–911)

## 2013-12-22 LAB — VITAMIN D 25 HYDROXY (VIT D DEFICIENCY, FRACTURES): VITD: 31.21 ng/mL (ref 30.00–100.00)

## 2013-12-22 NOTE — Assessment & Plan Note (Signed)
Check D level in light of neuropathy symptoms

## 2013-12-22 NOTE — Assessment & Plan Note (Signed)
Lab Results  Component Value Date   HGBA1C 5.9 12/17/2013    This is controlled with diet

## 2013-12-22 NOTE — Patient Instructions (Addendum)
Your symptoms are consistent with peripheral neuropathy  More labs today-thyroid and B12 and vit D Get back to your vitamin D  Also get a B complex vitamin over the counter  Based on results we may refer you to a neurologist

## 2013-12-22 NOTE — Progress Notes (Signed)
Pre visit review using our clinic review tool, if applicable. No additional management support is needed unless otherwise documented below in the visit note. 

## 2013-12-22 NOTE — Progress Notes (Signed)
Subjective:    Patient ID: Sandy Harper, female    DOB: 06/19/1940, 73 y.o.   MRN: [id number]  HPI Here for foot pain (peripheral neuropathy)  She takes gabapentin for it - she takes 600 mg three times per day - it makes her sleepy but helps   She has not had a big work up /not had ncv   She has not been on lyrica yet   Numb feet and achey feeling - a little worse on the L  They only feel normal first thing in the am  She wants to find a cause of her neuropathy  Social drinker - once in a while  Is not taking vit D Does not take a mvi or B vitamin   Wonders if it could be from her back (she has had several back surgeries)  Let pain went away after her last fusion   She was worried about DM Lab Results  Component Value Date   HGBA1C 5.9 12/17/2013   this is good    Chemistry      Component Value Date/Time   NA 140 12/17/2013 0930   K 4.8 12/17/2013 0930   CL 105 12/17/2013 0930   CO2 27 12/17/2013 0930   BUN 19 12/17/2013 0930   CREATININE 0.7 12/17/2013 0930      Component Value Date/Time   CALCIUM 9.2 12/17/2013 0930   ALKPHOS 75 12/17/2013 0930   AST 21 12/17/2013 0930   ALT 13 12/17/2013 0930   BILITOT 0.6 12/17/2013 0930        Review of Systems Review of Systems  Constitutional: Negative for fever, appetite change, and unexpected weight change. pos for fatigue and malaise and difficulty loosing weight  Eyes: Negative for pain and visual disturbance.  Respiratory: Negative for cough and shortness of breath.   Cardiovascular: Negative for cp or palpitations    Gastrointestinal: Negative for nausea, diarrhea and constipation.  Genitourinary: Negative for urgency and frequency.  Skin: Negative for pallor or rash   MSK pos for foot pain  Neurological: Negative for weakness, light-headedness,and headaches. pos for n/t of feet  Hematological: Negative for adenopathy. Does not bruise/bleed easily.  Psychiatric/Behavioral: Negative for dysphoric mood. The patient is not  nervous/anxious.         Objective:   Physical Exam  Constitutional: She appears well-developed and well-nourished. No distress.  obese and well appearing   HENT:  Head: Normocephalic and atraumatic.  Eyes: Conjunctivae and EOM are normal. Pupils are equal, round, and reactive to light.  Neck: Normal range of motion. Neck supple. No JVD present. No thyromegaly present.  Cardiovascular: Normal rate and regular rhythm.   Pulmonary/Chest: Effort normal and breath sounds normal. No respiratory distress. She has no wheezes. She has no rales.  Musculoskeletal: She exhibits no edema.  Lymphadenopathy:    She has no cervical adenopathy.  Neurological: She is alert. She has normal reflexes. She displays no atrophy and no tremor. A sensory deficit is present. No cranial nerve deficit. She exhibits normal muscle tone. Coordination and gait normal.  Decreased sensation to lt touch in bilat feet-worse on the L   Skin: Skin is warm and dry. No rash noted. No erythema.  Psychiatric: She has a normal mood and affect.          Assessment & Plan:   Problem List Items Addressed This Visit     Nervous and Auditory   Peripheral neuropathy      Getting worse despite  gabapentin  Unsure of etiol Not DM related  Lab Results  Component Value Date   HGBA1C 5.9 12/17/2013    Will check tsh/ B12 and D levels  If all nl consider neuro ref for NCV testing and further eval and tx     Relevant Orders      TSH (Completed)      Vitamin B12 (Completed)     Musculoskeletal and Integument   OSTEOPENIA   Relevant Orders      Vit D  25 hydroxy (rtn osteoporosis monitoring) (Completed)     Other   HYPERGLYCEMIA - Primary

## 2013-12-22 NOTE — Assessment & Plan Note (Signed)
Getting worse despite gabapentin  Unsure of etiol Not DM related  Lab Results  Component Value Date   HGBA1C 5.9 12/17/2013    Will check tsh/ B12 and D levels  If all nl consider neuro ref for NCV testing and further eval and tx

## 2013-12-25 IMAGING — RF DG LUMBAR SPINE 2-3V
1 series · 2 of 2 positions shown · non-contrast
Comparison: None.

CLINICAL DATA: Back pain

DG C-ARM 1-60 MIN,LUMBAR SPINE - 2-3 VIEW

[Series 1: run · 2 of 2 slices shown]
[im 1/2]
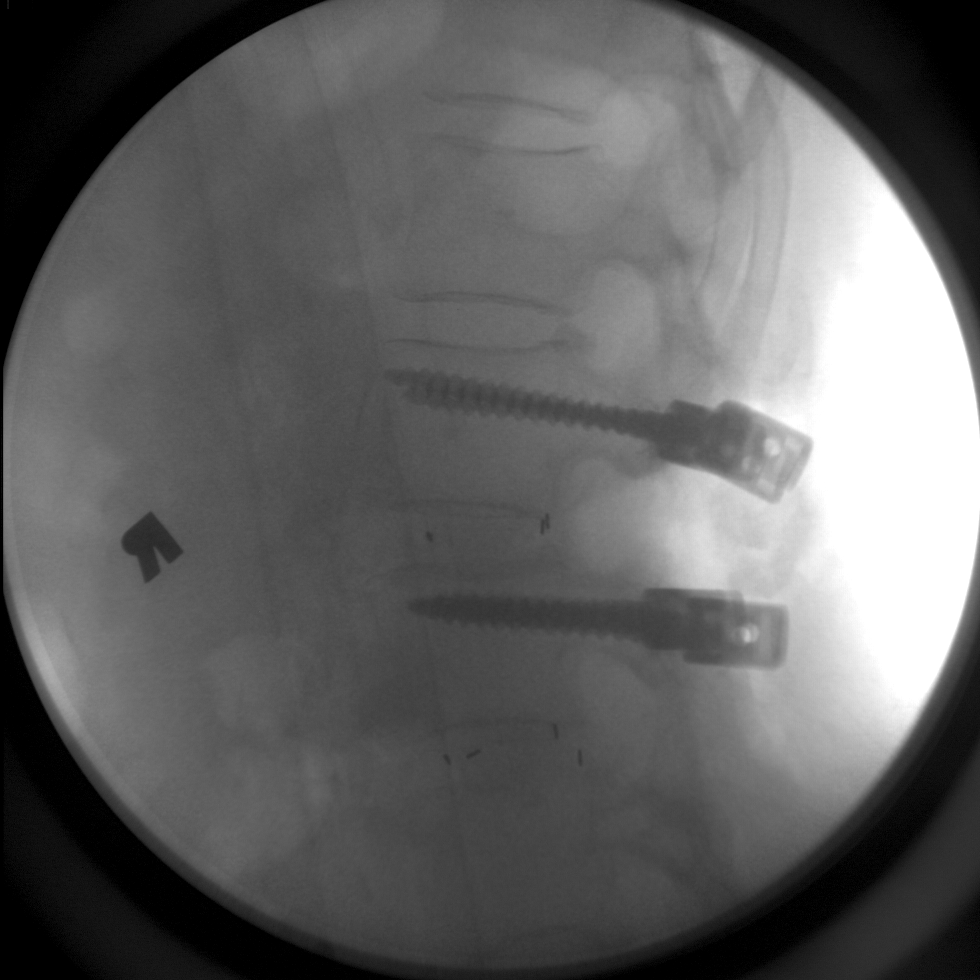
[im 2/2]
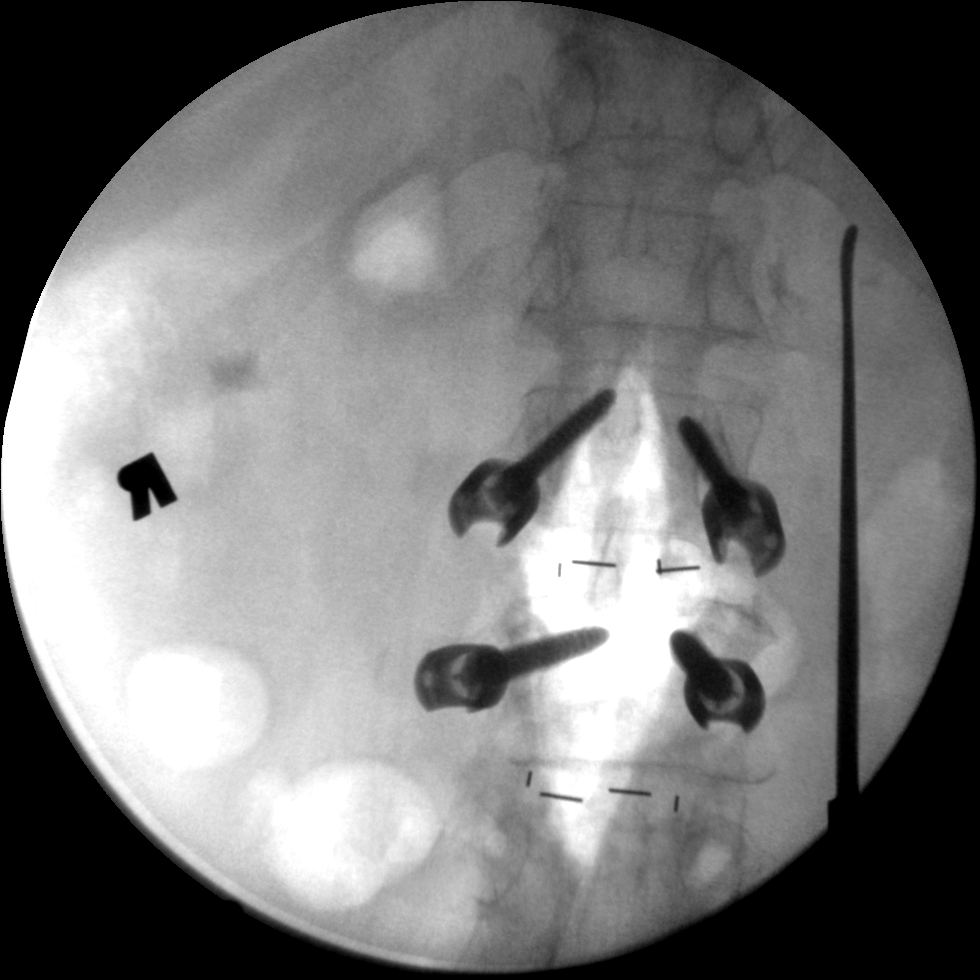

[2 of 2 positions shown; findings below may reference images not displayed]

FINDINGS: L3 screws and rods have been removed and replaced with L1
screws to which the L2 screws will be connected.  Interbody cage
placed L1-L2.
IMPRESSION: As above.

## 2014-01-15 ENCOUNTER — Ambulatory Visit: Payer: Medicare Other

## 2014-01-15 ENCOUNTER — Other Ambulatory Visit: Payer: Self-pay | Admitting: Family Medicine

## 2014-01-15 NOTE — Telephone Encounter (Signed)
Electronic refill request, please advise  

## 2014-01-15 NOTE — Telephone Encounter (Signed)
Px written for call in   

## 2014-01-16 NOTE — Telephone Encounter (Signed)
Rx called in as prescribed 

## 2014-01-20 ENCOUNTER — Ambulatory Visit (INDEPENDENT_AMBULATORY_CARE_PROVIDER_SITE_OTHER): Payer: Medicare Other

## 2014-01-20 DIAGNOSIS — E538 Deficiency of other specified B group vitamins: Secondary | ICD-10-CM

## 2014-01-20 MED ORDER — CYANOCOBALAMIN 1000 MCG/ML IJ SOLN
1000.0000 ug | Freq: Once | INTRAMUSCULAR | Status: AC
  Administered 2014-01-20: 1000 ug via INTRAMUSCULAR

## 2014-01-27 ENCOUNTER — Ambulatory Visit (INDEPENDENT_AMBULATORY_CARE_PROVIDER_SITE_OTHER): Payer: Medicare Other

## 2014-01-27 DIAGNOSIS — E538 Deficiency of other specified B group vitamins: Secondary | ICD-10-CM

## 2014-01-27 MED ORDER — CYANOCOBALAMIN 1000 MCG/ML IJ SOLN
1000.0000 ug | Freq: Once | INTRAMUSCULAR | Status: AC
  Administered 2014-01-27: 1000 ug via INTRAMUSCULAR

## 2014-01-27 MED ORDER — CYANOCOBALAMIN 1000 MCG/ML IJ SOLN: 1000.0000 ug | Freq: Once | INTRAMUSCULAR | Status: DC

## 2014-02-03 ENCOUNTER — Ambulatory Visit (INDEPENDENT_AMBULATORY_CARE_PROVIDER_SITE_OTHER): Payer: Medicare Other | Admitting: Family Medicine

## 2014-02-03 DIAGNOSIS — E559 Vitamin D deficiency, unspecified: Secondary | ICD-10-CM

## 2014-02-03 MED ORDER — CYANOCOBALAMIN 1000 MCG/ML IJ SOLN
1000.0000 ug | Freq: Once | INTRAMUSCULAR | Status: AC
  Administered 2014-02-03: 1000 ug via INTRAMUSCULAR

## 2014-02-04 ENCOUNTER — Telehealth: Payer: Self-pay

## 2014-02-04 NOTE — Telephone Encounter (Signed)
Pt left v/m; pt went to [facility] pharmacy today and was advised B 12 injection had been called in for pt. Pt did not pick up med; spoke with [facility] and was sent in error and pharmacy will d/c request. Pt notified and voiced understanding and will come for her nurse visit for B 12 on 02/10/14.

## 2014-02-10 ENCOUNTER — Ambulatory Visit (INDEPENDENT_AMBULATORY_CARE_PROVIDER_SITE_OTHER): Payer: Medicare Other

## 2014-02-10 DIAGNOSIS — E538 Deficiency of other specified B group vitamins: Secondary | ICD-10-CM

## 2014-02-10 MED ORDER — CYANOCOBALAMIN 1000 MCG/ML IJ SOLN
1000.0000 ug | Freq: Once | INTRAMUSCULAR | Status: AC
Start: 2014-02-10 — End: 2014-02-10
  Administered 2014-02-10: 1000 ug via INTRAMUSCULAR

## 2014-02-18 ENCOUNTER — Other Ambulatory Visit (INDEPENDENT_AMBULATORY_CARE_PROVIDER_SITE_OTHER): Payer: Medicare Other

## 2014-02-18 ENCOUNTER — Telehealth: Payer: Self-pay | Admitting: Family Medicine

## 2014-02-18 DIAGNOSIS — E538 Deficiency of other specified B group vitamins: Secondary | ICD-10-CM | POA: Insufficient documentation

## 2014-02-18 LAB — VITAMIN B12: VITAMIN B 12: 626 pg/mL (ref 211–911)

## 2014-02-18 NOTE — Telephone Encounter (Signed)
Message copied by Judy Pimple on Wed Feb 18, 2014  6:53 AM ------      Message from: Alvina Chou      Created: Mon Feb 16, 2014 11:58 AM      Regarding: Lab orders for Wednesday, 9.23.15       Lab order, no f/u appt ------

## 2014-02-18 NOTE — Telephone Encounter (Signed)
Pt came in today for labs she wants a phone call with the results  She does not want labs results put on my chart.  She is having problems getting on my chart

## 2014-02-18 NOTE — Telephone Encounter (Signed)
Message copied by Judy Pimple on Wed Feb 18, 2014  6:54 AM ------      Message from: Alvina Chou      Created: Mon Feb 16, 2014 11:58 AM      Regarding: Lab orders for Wednesday, 9.23.15       Lab order, no f/u appt ------

## 2014-02-19 NOTE — Telephone Encounter (Signed)
Pt returned Tasha's call about lab results.

## 2014-02-20 ENCOUNTER — Telehealth: Payer: Self-pay | Admitting: Family Medicine

## 2014-02-20 ENCOUNTER — Ambulatory Visit (INDEPENDENT_AMBULATORY_CARE_PROVIDER_SITE_OTHER): Payer: Medicare Other | Admitting: Family Medicine

## 2014-02-20 ENCOUNTER — Encounter: Payer: Self-pay | Admitting: Family Medicine

## 2014-02-20 VITALS — BP 134/90 | HR 71 | Temp 99.0°F | Ht 62.0 in

## 2014-02-20 DIAGNOSIS — M545 Low back pain, unspecified: Secondary | ICD-10-CM | POA: Insufficient documentation

## 2014-02-20 DIAGNOSIS — M79605 Pain in left leg: Secondary | ICD-10-CM | POA: Insufficient documentation

## 2014-02-20 DIAGNOSIS — M79609 Pain in unspecified limb: Secondary | ICD-10-CM

## 2014-02-20 MED ORDER — HYDROCODONE-ACETAMINOPHEN 5-325 MG PO TABS: 1.0000 | ORAL_TABLET | Freq: Four times a day (QID) | ORAL | Status: DC | PRN

## 2014-02-20 MED ORDER — PREDNISONE 10 MG PO TABS: ORAL_TABLET | ORAL | Status: DC

## 2014-02-20 NOTE — Progress Notes (Signed)
Pre visit review using our clinic review tool, if applicable. No additional management support is needed unless otherwise documented below in the visit note. 

## 2014-02-20 NOTE — Telephone Encounter (Signed)
I will see her then  

## 2014-02-20 NOTE — Progress Notes (Signed)
Subjective:    Patient ID: Sandy Harper, female    DOB: [birth year], 73 y.o.   MRN: [id number]  HPI Here for leg pain  Started Wed - went out to lunch and went shopping- her L leg began to hurt-more than usual No trauma or new activity  Got worse as the day goes on  Excruciating pain  A little in buttock and low back -not as bad as the leg  Improves to lie down  Much worse to try to stand  Is sometimes sharp and sometimes dull   Lateral thigh and calf hurt the most  Movement and strength is ok  Also feels more numb in lateral calf    Saw Dr Newell Coral on tues- everything was looking good  He did xrays   Has decompression and fusion in the past  Thinks she has had MRIs since then  Still has deg change at L4-L5   Has a cane -can walk with it with assistance    Patient Active Problem List   Diagnosis Date Noted  . B12 deficiency 02/18/2014  . Diverticulosis 06/30/2013  . History of motion sickness 06/30/2013  . Diverticulitis 04/08/2013  . Peripheral neuropathy 04/08/2013  . Encounter for Medicare annual wellness exam 09/24/2012  . Leg cramps 08/28/2011  . Other screening mammogram 09/19/2010  . HEMORRHOIDS, EXTERNAL 12/30/2009  . OVARIAN CYST 08/23/2009  . BACK PAIN, THORACIC REGION, RIGHT 03/03/2009  . UNSPECIFIED VITAMIN D DEFICIENCY 11/12/2007  . DEPRESSION 10/01/2007  . GERD 10/01/2007  . HYPERGLYCEMIA 10/01/2007  . CALCULUS, URINARY NOS 02/26/2007  . TOBACCO ABUSE 01/14/2007  . FIBROCYSTIC BREAST DISEASE 01/14/2007  . VITILIGO 01/14/2007  . DEGENERATIVE JOINT DISEASE 01/14/2007  . OSTEOPENIA 01/14/2007  . SLEEP DISORDER 01/14/2007  . INSOMNIA 01/14/2007  . MURMUR 01/14/2007   Past Medical History  Diagnosis Date  . Osteopenia   . Sleep disorder   . Kidney stones     With lithotripsy and stent  . Hyperlipidemia   . Hyperglycemia   . Depression   . Vitiligo   . Tobacco abuse   . Urine WBC increased 2005    Urology work-normal  . Heart murmur    echocardiogram- 10 yrs. ago- wnl  . Heart palpitations 2001    due to stress - pt. feels that it has resolved & it was related to stress at that time  . Degenerative joint disease     lumbar region & L hand - cortisone injection in past    Past Surgical History  Procedure Laterality Date  . Carpal tunnel release      bilateral  . Hemorrhoid surgery    . Oophorectomy      Left cyst  . Breast biopsy      X 2 left, benign  . Trigger finger release  2003    Right hand  . Hand surgery  02/10  . Back surgery  02/11    1995- decompression, then followed by fusion in 2011  . Tubal ligation     History  Substance Use Topics  . Smoking status: Light Tobacco Smoker  . Smokeless tobacco: Not on file     Comment: only smokes E-cigarettes  . Alcohol Use: Yes     Comment: social/ wine- once q two weeks    Family History  Problem Relation Age of Onset  . Heart disease Father     MI  . Cancer Sister     uterine cancer  . Cancer Brother  colon  . Diabetes Brother   . Heart disease Brother     CAD  . Diabetes Brother   . Cancer Brother     prostate  . Diabetes Brother    Allergies  Allergen Reactions  . Alendronate Sodium Other (See Comments)    reflux  . Bupropion Hcl Palpitations     heart palpitations   Current Outpatient Prescriptions on File Prior to Visit  Medication Sig Dispense Refill  . cholecalciferol (VITAMIN D) 1000 UNITS tablet Take 1,000 Units by mouth 3 (three) times daily.      . cyanocobalamin (,VITAMIN B-12,) 1000 MCG/ML injection Inject 1,000 mcg into the muscle once a week. For total of 4 weeks      . Docusate Calcium (STOOL SOFTENER PO) Take 1-2 capsules by mouth 2 (two) times daily as needed (Constipation).      . gabapentin (NEURONTIN) 300 MG capsule TAKE TWO CAPSULES THREE TIMES DAILY  180 capsule  5  . meloxicam (MOBIC) 15 MG tablet Take 1 tablet by mouth daily.      . Ospemifene (OSPHENA) 60 MG TABS Take 1 tablet by mouth daily.      Marland Kitchen zolpidem  (AMBIEN) 10 MG tablet TAKE 1 TABLET AT BEDTIME AS NEEDED FOR SLEEP  30 tablet  5   No current facility-administered medications on file prior to visit.     Review of Systems Review of Systems  Constitutional: Negative for fever, appetite change, fatigue and unexpected weight change.  Eyes: Negative for pain and visual disturbance.  Respiratory: Negative for cough and shortness of breath.   Cardiovascular: Negative for cp or palpitations    Gastrointestinal: Negative for nausea, diarrhea and constipation.  Genitourinary: Negative for urgency and frequency.  Skin: Negative for pallor or rash   MSK pos for severe low back and L leg pain  Neurological: Negative for weakness, light-headedness, and headaches. pos for neuropathy in LEs  Hematological: Negative for adenopathy. Does not bruise/bleed easily.  Psychiatric/Behavioral: Negative for dysphoric mood. The patient is not nervous/anxious.         Objective:   Physical Exam  Constitutional: She appears well-developed and well-nourished. No distress.  obese and well appearing   HENT:  Head: Normocephalic and atraumatic.  Mouth/Throat: Oropharynx is clear and moist.  Eyes: Conjunctivae and EOM are normal. Pupils are equal, round, and reactive to light. Right eye exhibits no discharge. Left eye exhibits no discharge. No scleral icterus.  Neck: Normal range of motion. Neck supple.  Cardiovascular: Normal rate and regular rhythm.   Murmur heard. Pulmonary/Chest: Effort normal and breath sounds normal. No respiratory distress. She has no wheezes. She has no rales.  Musculoskeletal: She exhibits tenderness. She exhibits no edema.  Tender over lower lumbar spine  Pt unable to get up from wheelchair due to pain  Pain on bent knee raise of L leg  No knee or hip tenderness   No palp cords  No edema  No erythema or swelling of leg   Very limited rom spine due to pain   Pt is very uncomfortable   Lymphadenopathy:    She has no cervical  adenopathy.  Neurological: She is alert. She has normal reflexes. A sensory deficit is present. No cranial nerve deficit. She exhibits normal muscle tone. Coordination normal.  Baseline dec sens in feet   Skin: Skin is warm and dry. No rash noted. No erythema.  Psychiatric: She has a normal mood and affect.  Assessment & Plan:   Problem List Items Addressed This Visit     Other   Left leg pain - Primary     Suspect this is rel to LS disease-seems to be referred  S/p 2 lumbar surgeries in the past incl fusion  Had xray on tues but symptoms developed the next day  Will tx with prednisone taper/ warned re; side eff  Ref to neurosurg/ Dr Newell Coral for eval     Lumbar pain with radiation down left leg   Relevant Medications      predniSONE (DELTASONE) tablet      HYDROcodone-acetaminophen (NORCO/VICODIN) 5-325 MG per tablet   Other Relevant Orders      Ambulatory referral to Neurosurgery

## 2014-02-20 NOTE — Telephone Encounter (Signed)
Addressed at appt with Dr. Milinda Antis

## 2014-02-20 NOTE — Patient Instructions (Signed)
Start the prednisone taper  Take the norco for pain as needed - you may need a stool softener as needed for constipation  Stop up front for a referral to neurosurg/ Dr Newell Coral    If you loose motor control of foot or toes - go to the ER please

## 2014-02-20 NOTE — Telephone Encounter (Signed)
Patient Information:  Caller Name: Ell[place]  Phone: [phone]  Patient: Sandy Harper  Gender: Female  DOB: Sep 29, 1940  Age: 73 Years  PCP: [facility] [employer])  Office Follow Up:  Does the office need to follow up with this patient?: No  Instructions For The Office: N/A  RN Note:  Spoke with [place], CMA and per Dr Milinda Antis she is ok with pt coming to office for appt at 1230.  Pt made aware and agrees with this plan.  Symptoms  Reason For Call & Symptoms: 02/18/14 pain in left leg from hip down into ankle.  No injury.  02/20/14 pain continues, severe, unable to bear weight. Rates 10/10.   Taken Vicodin with no relief.   Can lay down and get in certain position and that's only relief.  Reviewed Health History In EMR: Yes  Reviewed Medications In EMR: Yes  Reviewed Allergies In EMR: Yes  Reviewed Surgeries / Procedures: Yes  Date of Onset of Symptoms: 02/18/2014  Treatments Tried: vicodin  Treatments Tried Worked: No  Guideline(s) Used:  Leg Pain  Disposition Per Guideline:   Go to ED Now  Reason For Disposition Reached:   Unable to walk  Advice Given:  N/A  RN Overrode Recommendation:  Make Appointment  Per Dr Milinda Antis, ok for appt at 1230  Appointment Scheduled:  02/20/2014 12:30:00 Appointment Scheduled Provider:  Roxy Manns [facility])

## 2014-02-21 NOTE — Assessment & Plan Note (Signed)
Suspect this is rel to LS disease-seems to be referred  S/p 2 lumbar surgeries in the past incl fusion  Had xray on tues but symptoms developed the next day  Will tx with prednisone taper/ warned re; side eff  Ref to neurosurg/ Dr Newell Coral for eval

## 2014-06-26 ENCOUNTER — Telehealth: Payer: Self-pay | Admitting: Family Medicine

## 2014-06-26 DIAGNOSIS — G629 Polyneuropathy, unspecified: Secondary | ICD-10-CM

## 2014-06-26 NOTE — Telephone Encounter (Signed)
Pt called and says her feet are still bothering her, left is worse. She says you're treating her for neuropathy, but she's not getting any better.  She wants to know what you feel she should do next.  If you decide to refer her she prefers that it be local [facility] but if you think [place], then [place].  She requests a c/b. Thank you.

## 2014-06-26 NOTE — Telephone Encounter (Signed)
Inform patient of Dr Royden Purlower's comments. Patient is aware for a call to set referral.

## 2014-06-26 NOTE — Telephone Encounter (Signed)
I will refer her to neurology - tell her she will get a call likely next week to set that up

## 2014-07-09 ENCOUNTER — Encounter: Payer: Self-pay | Admitting: Neurology

## 2014-07-09 ENCOUNTER — Ambulatory Visit (INDEPENDENT_AMBULATORY_CARE_PROVIDER_SITE_OTHER): Payer: Medicare Other | Admitting: Neurology

## 2014-07-09 VITALS — BP 130/84 | HR 78 | Ht 62.0 in | Wt 155.5 lb

## 2014-07-09 DIAGNOSIS — G609 Hereditary and idiopathic neuropathy, unspecified: Secondary | ICD-10-CM

## 2014-07-09 MED ORDER — NORTRIPTYLINE HCL 10 MG PO CAPS: ORAL_CAPSULE | ORAL | Status: DC

## 2014-07-09 NOTE — Progress Notes (Signed)
[facility] Neurology [facility] Clinic Note - Initial Visit   Date: 07/09/2014  Sandy Harper MRN: [id number] DOB: 03-08-1941   Dear Dr. Milinda Antis:  Thank you for your kind referral of Sandy Harper for consultation of neuropathy. Although her history is well known to you, please allow [place] to reiterate it for the purpose of our medical record. The patient was accompanied to the clinic by self.   History of Present Illness: Sandy Harper is a 74 y.o. right-handed Caucasian female with hyperlipidemia, osteopenia, depression, tobacco use, lumbar stenosis and spondylosis s/p L2-L3 decompression (2011) and L1-2 decompression (2014), bilateral CTS release and osteoarthritis presenting for evaluation of numbness/tingling of the feet.    Starting around 2013, she developing numbness and tingling involving the toes which has slowly become constant and involved a greater area of the sole and dorsum of her feet.  She feels that her feet are cold all the time.  She denies any weakness. Her balance seems to be off and is worse when climbing stairs.  She continues to walk independently and has not had any falls.  She was started on gabapentin and takes  TID, but does not feel that it makes any difference. She has not tried any other medication for neuropathy.  Prior work-up has been limited to serology testing for diabetes with HbA1c, thyroid disease, and vitamin B12 deficiency of which she was being supplemented with B12 injections.  She has an extensive family history of diabetes and is concerned this may be contributing, but her lab tests have not disclosed diabetes.  She had not had a glucose tolerance.   She had a long history of degenerative joint disease and lumbar stenosis which is being followed by Dr. Newell Coral.  She had underwent lumbar decompression x 2 (L1-2 and L2-3) and currently denies any back problems.   Out-side paper records, electronic medical record, and images have been reviewed  where available and summarized as:  MRI lumbar spine 07/10/2011: Lumbar arachnoiditis is present, unchanged. Satisfactory fusion at L2-3. Progression of degenerative change and stenosis at L1-2. There is moderately severe stenosis at this level. Grade 1 anterior slip L3-4 with mild to moderate spinal stenosis Advanced disc degeneration at L4-5. Central and left-sided osteophyte with left foraminal encroachment and left lateral recess encroachment. This is unchanged.  Lab Results  Component Value Date   TSH 2.21 12/22/2013   Lab Results  Component Value Date   HGBA1C 5.9 12/17/2013   Lab Results  Component Value Date   VITAMINB12 626 02/18/2014     Past Medical History  Diagnosis Date  . Osteopenia   . Sleep disorder   . Kidney stones     With lithotripsy and stent  . Hyperlipidemia   . Hyperglycemia   . Depression   . Vitiligo   . Tobacco abuse   . Urine WBC increased 2005    Urology work-normal  . Heart murmur     echocardiogram- 10 yrs. ago- wnl  . Heart palpitations 2001    due to stress - pt. feels that it has resolved & it was related to stress at that time  . Degenerative joint disease     lumbar region & L hand - cortisone injection in past     Past Surgical History  Procedure Laterality Date  . Carpal tunnel release      bilateral  . Hemorrhoid surgery    . Oophorectomy      Left cyst  . Breast biopsy  X 2 left, benign  . Trigger finger release  2003    Right hand  . Hand surgery  02/10  . Back surgery  02/11    1995- decompression, then followed by fusion in 2011  . Tubal ligation       Medications:  Current Outpatient Prescriptions on File Prior to Visit  Medication Sig Dispense Refill  . Docusate Calcium (STOOL SOFTENER PO) Take 1-2 capsules by mouth 2 (two) times daily as needed (Constipation).    . gabapentin (NEURONTIN) 300 MG capsule TAKE TWO CAPSULES THREE TIMES DAILY 180 capsule 5  . meloxicam (MOBIC) 15 MG tablet Take 1 tablet  by mouth daily.    Marland Kitchen. zolpidem (AMBIEN) 10 MG tablet TAKE 1 TABLET AT BEDTIME AS NEEDED FOR SLEEP 30 tablet 5   No current facility-administered medications on file prior to visit.    Allergies:  Allergies  Allergen Reactions  . Alendronate Sodium Other (See Comments)    reflux  . Bupropion Hcl Palpitations     heart palpitations    Family History: Family History  Problem Relation Age of Onset  . Heart disease Father     MI  . Cancer Sister     uterine cancer  . Cancer Brother     colon  . Diabetes Brother   . Heart disease Brother     CAD  . Diabetes Brother   . Cancer Brother     prostate  . Diabetes Brother     Social History: History   Social History  . Marital Status: Married    Spouse Name: N/A  . Number of Children: 2  . Years of Education: N/A   Occupational History  . Not on file.   Social History Main Topics  . Smoking status: Current Every Day Smoker -- 0.50 packs/day for 50 years    Types: Cigarettes  . Smokeless tobacco: Not on file     Comment: only smokes E-cigarettes  . Alcohol Use: 0.0 oz/week    0 Standard drinks or equivalent per week     Comment: social/ wine- once q two weeks   . Drug Use: No  . Sexual Activity: Not on file   Other Topics Concern  . Not on file   Social History Narrative   Lives with husband in a one story home.  Has 3 grown children.  Education: high school.  Mostly a home maker.     Review of Systems:  CONSTITUTIONAL: No fevers, chills, night sweats, or weight loss.   EYES: No visual changes or eye pain ENT: No hearing changes.  No history of nose bleeds.   RESPIRATORY: No cough, wheezing and shortness of breath.   CARDIOVASCULAR: Negative for chest pain, and palpitations.   GI: Negative for abdominal discomfort, blood in stools or black stools.  No recent change in bowel habits.   GU:  No history of incontinence.   MUSCLOSKELETAL: +history of joint pain or swelling.  No myalgias.   SKIN: Negative for  lesions, rash, and itching.   HEMATOLOGY/ONCOLOGY: Negative for prolonged bleeding, bruising easily, and swollen nodes.   ENDOCRINE: Negative for cold or heat intolerance, polydipsia or goiter.   PSYCH:  No depression or anxiety symptoms.   NEURO: As Above.   Vital Signs:  BP 130/84 mmHg  Pulse 78  Ht 5\' 2"  (1.575 m)  Wt 155 lb 8 oz (70.534 kg)  BMI 28.43 kg/m2  SpO2 99%  LMP 05/30/1995   General Medical Exam:  General:  Well appearing, comfortable.   Eyes/ENT: see cranial nerve examination.   Neck: No masses appreciated.  Full range of motion without tenderness.  No carotid bruits. Respiratory:  Clear to auscultation, good air entry bilaterally.   Cardiac:  Systolic murmur, Regular rate and rhythm Extremities:  No deformities, edema, or skin discoloration.  Skin:  No rashes or lesions.  Neurological Exam: MENTAL STATUS including orientation to time, place, person, recent and remote memory, attention span and concentration, language, and fund of knowledge is normal.  Speech is not dysarthric.  CRANIAL NERVES: II:  No visual field defects.  Unremarkable fundi.   III-IV-VI: Pupils equal round and reactive to light.  Normal conjugate, extra-ocular eye movements in all directions of gaze.  No nystagmus.  No ptosis.   V:  Normal facial sensation.    VII:  Normal facial symmetry and movements.  No pathologic facial reflexes.  VIII:  Normal hearing and vestibular function.   IX-X:  Normal palatal movement.   XI:  Normal shoulder shrug and head rotation.   XII:  Normal tongue strength and range of motion, no deviation or fasciculation.  MOTOR:  No atrophy, fasciculations or abnormal movements.  No pronator drift.  Tone is normal.    Right Upper Extremity:    Left Upper Extremity:    Deltoid  5/5   Deltoid  5/5   Biceps  5/5   Biceps  5/5   Triceps  5/5   Triceps  5/5   Wrist extensors  5/5   Wrist extensors  5/5   Wrist flexors  5/5   Wrist flexors  5/5   Finger extensors  5/5    Finger extensors  5/5   Finger flexors  5/5   Finger flexors  5/5   Dorsal interossei  5/5   Dorsal interossei  5/5   Abductor pollicis  5/5   Abductor pollicis  5/5   Tone (Ashworth scale)  0  Tone (Ashworth scale)  0   Right Lower Extremity:    Left Lower Extremity:    Hip flexors  5/5   Hip flexors  5/5   Hip extensors  5/5   Hip extensors  5/5   Knee flexors  5/5   Knee flexors  5/5   Knee extensors  5/5   Knee extensors  5/5   Dorsiflexors  5/5   Dorsiflexors  5/5   Plantarflexors  5/5   Plantarflexors  5/5   Toe extensors  5/5   Toe extensors  5-/5   Toe flexors  5/5   Toe flexors  5/5   Tone (Ashworth scale)  0  Tone (Ashworth scale)  0   MSRs:  Right                                                                 Left brachioradialis 2+  brachioradialis 2+  biceps 2+  biceps 2+  triceps 2+  triceps 2+  patellar trace  Patellar trace  ankle jerk 0  ankle jerk 0  Hoffman no  Hoffman no  plantar response down  plantar response down   SENSORY:  Vibration and temperature reduced at the feet bilaterally.  Light touch, pin prick, and proprioception intact. Romberg's sign absent.   COORDINATION/GAIT: Normal  finger-to- nose-finger and heel-to-shin.  Intact rapid alternating movements bilaterally.  Able to rise from a chair without using arms.  Gait narrow based and stable. Tandem and stressed gait intact.    IMPRESSION: Mrs. Hertenstein is a 74 year-old female presenting for evaluation of painful paresthesias involving her feet.  Her neurological examination shows a distal predominant large fiber peripheral neuropathy. I had extensive discussion with the patient regarding the pathogenesis, etiology, management, and natural course of neuropathy. Neuropathy tends to be slowly progressive, especially if a treatable etiology is not identified.I discussed that in the vast majority of cases, despite checking for reversible causes, we are unable to find the underlying etiology and management is  symptomatic.    She has a strong family history of diabetes, but her diabetic screening has always returned normal.  For completeness, I will order 2hr-glucose tolerance test since this is most sensitive as well as other secondary causes of neuropathy.  I offered electrodiagnostic testing since that would be confirmatory, but she is not interested due to intolerance of testing in the past. With her classic history and exam findings, I do not think NCS/EMG would add much to her diagnostic work-up, but if her symptoms evolve in a manner atypical for neuropathy, would be reasonable to reconsider testing.  I further agree with her providers that paresthesias are unrelated to her lumbar stenosis.  From a symptomatic standpoint, she is currently on gabapentin  TID without any noticeable benefit, so I would not increase this.  Instead, nortriptyline will be added and up titrated, if she tolerates it.  There are many other options to consider including Lyrica, Cymbalta, venlafaxine, other TCAs, and lidocaine ointment.   PLAN/RECOMMENDATIONS:  1.  Check 2hr glucose tolerance test, vitamin B1, vitamin B6, copper, ceruloplasmin, SPEP/UPEP with IFE 2.  Start nortriptyline  at bedtime x 2 weeks, then increase to  at bedtime. 3.  Alternative therapies including soaking feet in epson salt, vicks vaporub, or Asper cream to feet was offered 4.  Return to clinic in 3 months   The duration of this appointment visit was 50 minutes of face-to-face time with the patient.  Greater than 50% of this time was spent in counseling, explanation of diagnosis, planning of further management, and coordination of care.   Thank you for allowing me to participate in patient's care.  If I can answer any additional questions, I would be pleased to do so.    Sincerely,    Vayla Wilhelmi K. Allena Katz, DO

## 2014-07-09 NOTE — Patient Instructions (Addendum)
1.  Start nortriptyline 10mg  at bedtime x 2 weeks, then increase to 20mg  at bedtime. 2.  Check blood work  3.  Call me with an update 6 weeks 4.  You can try soaking feet in epson salt, vicks vaporub, or Asper cream to your feet 5.  Return to clinic in 3 months

## 2014-07-10 LAB — COPPER, SERUM: Copper: 123 ug/dL (ref 70–175)

## 2014-07-12 LAB — VITAMIN B1: VITAMIN B1 (THIAMINE): 12 nmol/L (ref 8–30)

## 2014-07-13 LAB — UIFE/LIGHT CHAINS/TP QN, 24-HR UR
Albumin, U: DETECTED
Alpha 1, Urine: DETECTED — AB
Alpha 2, Urine: DETECTED — AB
Beta, Urine: DETECTED — AB
Gamma Globulin, Urine: DETECTED — AB
TOTAL PROTEIN, URINE-UPE24: 11 mg/dL (ref 5–24)

## 2014-07-13 LAB — SPEP & IFE WITH QIG
ALBUMIN ELP: 60.7 % (ref 55.8–66.1)
Alpha-1-Globulin: 4.2 % (ref 2.9–4.9)
Alpha-2-Globulin: 13 % — ABNORMAL HIGH (ref 7.1–11.8)
BETA GLOBULIN: 6.9 % (ref 4.7–7.2)
Beta 2: 5.8 % (ref 3.2–6.5)
Gamma Globulin: 9.4 % — ABNORMAL LOW (ref 11.1–18.8)
IgA: 259 mg/dL (ref 69–380)
IgG (Immunoglobin G), Serum: 727 mg/dL (ref 690–1700)
IgM, Serum: 23 mg/dL — ABNORMAL LOW (ref 52–322)
Total Protein, Serum Electrophoresis: 6.8 g/dL (ref 6.0–8.3)

## 2014-07-13 LAB — CERULOPLASMIN: Ceruloplasmin: 32 mg/dL (ref 18–53)

## 2014-07-14 LAB — VITAMIN B6: Vitamin B6: 12.5 ng/mL (ref 2.1–21.7)

## 2014-07-16 ENCOUNTER — Other Ambulatory Visit: Payer: Medicare Other

## 2014-07-16 DIAGNOSIS — G609 Hereditary and idiopathic neuropathy, unspecified: Secondary | ICD-10-CM

## 2014-07-16 LAB — GLUCOSE TOLERANCE, 2 HOURS
GLUCOSE, FASTING: 116 mg/dL — AB (ref 70–99)
Glucose, 1 Hour GTT: 239 mg/dL
Glucose, 2 hour: 206 mg/dL

## 2014-08-16 ENCOUNTER — Telehealth: Payer: Self-pay | Admitting: Family Medicine

## 2014-08-16 DIAGNOSIS — M8580 Other specified disorders of bone density and structure, unspecified site: Secondary | ICD-10-CM

## 2014-08-16 DIAGNOSIS — E559 Vitamin D deficiency, unspecified: Secondary | ICD-10-CM

## 2014-08-16 DIAGNOSIS — E538 Deficiency of other specified B group vitamins: Secondary | ICD-10-CM

## 2014-08-16 DIAGNOSIS — Z0000 Encounter for general adult medical examination without abnormal findings: Secondary | ICD-10-CM

## 2014-08-16 DIAGNOSIS — Z13220 Encounter for screening for lipoid disorders: Secondary | ICD-10-CM

## 2014-08-16 DIAGNOSIS — R739 Hyperglycemia, unspecified: Secondary | ICD-10-CM

## 2014-08-16 NOTE — Telephone Encounter (Signed)
-----   Message from Alvina Chouerri J Walsh sent at 08/12/2014  4:02 PM EDT ----- Regarding: Lab orders for Monday, 3.21.16 Lab orders for a f/u appt and a cpx a week after.

## 2014-08-18 ENCOUNTER — Other Ambulatory Visit (INDEPENDENT_AMBULATORY_CARE_PROVIDER_SITE_OTHER): Payer: Medicare Other

## 2014-08-18 DIAGNOSIS — E538 Deficiency of other specified B group vitamins: Secondary | ICD-10-CM | POA: Diagnosis not present

## 2014-08-18 DIAGNOSIS — M8580 Other specified disorders of bone density and structure, unspecified site: Secondary | ICD-10-CM

## 2014-08-18 DIAGNOSIS — E559 Vitamin D deficiency, unspecified: Secondary | ICD-10-CM

## 2014-08-18 DIAGNOSIS — R739 Hyperglycemia, unspecified: Secondary | ICD-10-CM | POA: Diagnosis not present

## 2014-08-18 DIAGNOSIS — Z13220 Encounter for screening for lipoid disorders: Secondary | ICD-10-CM

## 2014-08-18 LAB — CBC WITH DIFFERENTIAL/PLATELET
Basophils Absolute: 0 10*3/uL (ref 0.0–0.1)
Basophils Relative: 0.4 % (ref 0.0–3.0)
Eosinophils Absolute: 0.2 10*3/uL (ref 0.0–0.7)
Eosinophils Relative: 3.2 % (ref 0.0–5.0)
HEMATOCRIT: 42.8 % (ref 36.0–46.0)
HEMOGLOBIN: 14.6 g/dL (ref 12.0–15.0)
LYMPHS ABS: 1.7 10*3/uL (ref 0.7–4.0)
LYMPHS PCT: 29.2 % (ref 12.0–46.0)
MCHC: 34.2 g/dL (ref 30.0–36.0)
MCV: 94.9 fl (ref 78.0–100.0)
MONO ABS: 0.5 10*3/uL (ref 0.1–1.0)
Monocytes Relative: 8.4 % (ref 3.0–12.0)
Neutro Abs: 3.5 10*3/uL (ref 1.4–7.7)
Neutrophils Relative %: 58.8 % (ref 43.0–77.0)
Platelets: 252 10*3/uL (ref 150.0–400.0)
RBC: 4.51 Mil/uL (ref 3.87–5.11)
RDW: 14.4 % (ref 11.5–15.5)
WBC: 6 10*3/uL (ref 4.0–10.5)

## 2014-08-18 LAB — LIPID PANEL
CHOLESTEROL: 172 mg/dL (ref 0–200)
HDL: 54.3 mg/dL (ref >39.00)
LDL CALC: 85 mg/dL (ref 0–99)
NonHDL: 117.7
TRIGLYCERIDES: 166 mg/dL — AB (ref 0.0–149.0)
Total CHOL/HDL Ratio: 3
VLDL: 33.2 mg/dL (ref 0.0–40.0)

## 2014-08-18 LAB — COMPREHENSIVE METABOLIC PANEL
ALBUMIN: 4 g/dL (ref 3.5–5.2)
ALT: 13 U/L (ref 0–35)
AST: 17 U/L (ref 0–37)
Alkaline Phosphatase: 80 U/L (ref 39–117)
BILIRUBIN TOTAL: 0.5 mg/dL (ref 0.2–1.2)
BUN: 16 mg/dL (ref 6–23)
CHLORIDE: 105 meq/L (ref 96–112)
CO2: 29 mEq/L (ref 19–32)
CREATININE: 0.74 mg/dL (ref 0.40–1.20)
Calcium: 9.2 mg/dL (ref 8.4–10.5)
GFR: 81.74 mL/min (ref >60.00)
GLUCOSE: 118 mg/dL — AB (ref 70–99)
Potassium: 4.3 mEq/L (ref 3.5–5.1)
Sodium: 139 mEq/L (ref 135–145)
Total Protein: 6.6 g/dL (ref 6.0–8.3)

## 2014-08-18 LAB — VITAMIN D 25 HYDROXY (VIT D DEFICIENCY, FRACTURES): VITD: 18.12 ng/mL — ABNORMAL LOW (ref 30.00–100.00)

## 2014-08-18 LAB — VITAMIN B12: Vitamin B-12: 255 pg/mL (ref 211–911)

## 2014-08-18 LAB — HEMOGLOBIN A1C: HEMOGLOBIN A1C: 6 % (ref 4.6–6.5)

## 2014-08-31 ENCOUNTER — Ambulatory Visit: Payer: Medicare Other | Admitting: Family Medicine

## 2014-09-01 ENCOUNTER — Other Ambulatory Visit: Payer: Self-pay | Admitting: Family Medicine

## 2014-09-01 NOTE — Telephone Encounter (Signed)
Ambien refill request.  Last seen 02/20/2014.  Last filled 01/15/2014.  Please advise.

## 2014-09-01 NOTE — Telephone Encounter (Signed)
Px written for call in   

## 2014-09-02 NOTE — Telephone Encounter (Signed)
Ambien called in.

## 2014-09-08 ENCOUNTER — Ambulatory Visit (INDEPENDENT_AMBULATORY_CARE_PROVIDER_SITE_OTHER): Payer: Medicare Other | Admitting: Family Medicine

## 2014-09-08 ENCOUNTER — Encounter: Payer: Self-pay | Admitting: Family Medicine

## 2014-09-08 VITALS — BP 138/80 | HR 87 | Temp 98.2°F | Ht 60.5 in | Wt 155.8 lb

## 2014-09-08 DIAGNOSIS — Z23 Encounter for immunization: Secondary | ICD-10-CM

## 2014-09-08 DIAGNOSIS — Z789 Other specified health status: Secondary | ICD-10-CM

## 2014-09-08 DIAGNOSIS — M8580 Other specified disorders of bone density and structure, unspecified site: Secondary | ICD-10-CM | POA: Diagnosis not present

## 2014-09-08 DIAGNOSIS — R739 Hyperglycemia, unspecified: Secondary | ICD-10-CM

## 2014-09-08 DIAGNOSIS — Z720 Tobacco use: Secondary | ICD-10-CM

## 2014-09-08 DIAGNOSIS — E538 Deficiency of other specified B group vitamins: Secondary | ICD-10-CM

## 2014-09-08 DIAGNOSIS — Z0000 Encounter for general adult medical examination without abnormal findings: Secondary | ICD-10-CM

## 2014-09-08 DIAGNOSIS — E559 Vitamin D deficiency, unspecified: Secondary | ICD-10-CM

## 2014-09-08 MED ORDER — CYANOCOBALAMIN 1000 MCG/ML IJ SOLN
1000.0000 ug | Freq: Once | INTRAMUSCULAR | Status: AC
  Administered 2014-09-08: 1000 ug via INTRAMUSCULAR

## 2014-09-08 MED ORDER — METFORMIN HCL 500 MG PO TABS: 250.0000 mg | ORAL_TABLET | Freq: Two times a day (BID) | ORAL | Status: DC

## 2014-09-08 NOTE — Progress Notes (Signed)
Subjective:    Patient ID: Sandy Harper, female    DOB: 10/26/1940, 74 y.o.   MRN: [id number]  HPI Here for annual medicare wellness visit as well as chronic/acute medical problems   I have personally reviewed the Medicare Annual Wellness questionnaire and have noted 1. The patient's medical and social history 2. Their use of alcohol, tobacco or illicit drugs 3. Their current medications and supplements 4. The patient's functional ability including ADL's, fall risks, home safety risks and hearing or visual             impairment. 5. Diet and physical activities 6. Evidence for depression or mood disorders  The patients weight, height, BMI have been recorded in the chart and visual acuity is per eye clinic.  I have made referrals, counseling and provided education to the patient based review of the above and I have provided the pt with a written personalized care plan for preventive services.  See scanned forms.  Routine anticipatory guidance given to patient.  See health maintenance. Colon cancer screening 3/15 - 5 year recall (one more)  Breast cancer screening mammogram 7/14 -needs a mammo -=will schedule that herself  Self breast exam-no lumps  Flu vaccine 9/15  Tetanus vaccine 6/09 Pneumovax 1/08 , will do prevnar today  Zoster vaccine 4/13 dexa - does not want to do more of these , no fractures , she takes her ca and D and did not tol fosamax  Advance directive- does not have - given materials  Cognitive function addressed- see scanned forms- and if abnormal then additional documentation follows.  Pretty good memory , short term memory is quite good   PMH and SH reviewed  Meds, vitals, and allergies reviewed.   ROS: See HPI.  Otherwise negative.      being treated for neuropathy  Started nortriptyline - helping some   Had 3 hour GTT positive Lab Results  Component Value Date   HGBA1C 6.0 08/18/2014  she is very carefull with carbs - watching closely  Is open to  metformin   B12 level 255 - taking daily otc  She had 4 in 4 weeks shots as well  Will get another today   D level is low 18.2 Is taking 1000 iu D3  Will inc that to 4000 iu daily     Chemistry      Component Value Date/Time   NA 139 08/18/2014 0942   K 4.3 08/18/2014 0942   CL 105 08/18/2014 0942   CO2 29 08/18/2014 0942   BUN 16 08/18/2014 0942   CREATININE 0.74 08/18/2014 0942      Component Value Date/Time   CALCIUM 9.2 08/18/2014 0942   ALKPHOS 80 08/18/2014 0942   AST 17 08/18/2014 0942   ALT 13 08/18/2014 0942   BILITOT 0.5 08/18/2014 0942       Lab Results  Component Value Date   WBC 6.0 08/18/2014   HGB 14.6 08/18/2014   HCT 42.8 08/18/2014   MCV 94.9 08/18/2014   PLT 252.0 08/18/2014    Lab Results  Component Value Date   TSH 2.21 12/22/2013     Cholesterol Lab Results  Component Value Date   CHOL 172 08/18/2014   CHOL 189 09/26/2012   CHOL 204* 03/26/2012   Lab Results  Component Value Date   HDL 54.30 08/18/2014   HDL 64.50 09/26/2012   HDL 59.60 03/26/2012   Lab Results  Component Value Date   LDLCALC 85 08/18/2014  LDLCALC 89 09/26/2012   LDLCALC 100* 09/13/2010   Lab Results  Component Value Date   TRIG 166.0* 08/18/2014   TRIG 176.0* 09/26/2012   TRIG 149.0 03/26/2012   Lab Results  Component Value Date   CHOLHDL 3 08/18/2014   CHOLHDL 3 09/26/2012   CHOLHDL 3 03/26/2012   Lab Results  Component Value Date   LDLDIRECT 118.4 03/26/2012   LDLDIRECT 109.3 08/29/2011   LDLDIRECT 120.1 02/09/2009   cholesterol is pretty good overall    Patient Active Problem List   Diagnosis Date Noted  . Screening for lipoid disorders 08/16/2014  . Left leg pain 02/20/2014  . Lumbar pain with radiation down left leg 02/20/2014  . B12 deficiency 02/18/2014  . Diverticulosis 06/30/2013  . History of motion sickness 06/30/2013  . Diverticulitis 04/08/2013  . Peripheral neuropathy 04/08/2013  . Encounter for Medicare annual wellness  exam 09/24/2012  . Leg cramps 08/28/2011  . Other screening mammogram 09/19/2010  . HEMORRHOIDS, EXTERNAL 12/30/2009  . OVARIAN CYST 08/23/2009  . BACK PAIN, THORACIC REGION, RIGHT 03/03/2009  . Vitamin D deficiency 11/12/2007  . DEPRESSION 10/01/2007  . GERD 10/01/2007  . Hyperglycemia 10/01/2007  . CALCULUS, URINARY NOS 02/26/2007  . Nicotine vapor product user 01/14/2007  . FIBROCYSTIC BREAST DISEASE 01/14/2007  . VITILIGO 01/14/2007  . DEGENERATIVE JOINT DISEASE 01/14/2007  . Osteopenia 01/14/2007  . SLEEP DISORDER 01/14/2007  . INSOMNIA 01/14/2007  . MURMUR 01/14/2007   Past Medical History  Diagnosis Date  . Osteopenia   . Sleep disorder   . Kidney stones     With lithotripsy and stent  . Hyperlipidemia   . Hyperglycemia   . Depression   . Vitiligo   . Tobacco abuse   . Urine WBC increased 2005    Urology work-normal  . Heart murmur     echocardiogram- 10 yrs. ago- wnl  . Heart palpitations 2001    due to stress - pt. feels that it has resolved & it was related to stress at that time  . Degenerative joint disease     lumbar region & L hand - cortisone injection in past    Past Surgical History  Procedure Laterality Date  . Carpal tunnel release      bilateral  . Hemorrhoid surgery    . Oophorectomy      Left cyst  . Breast biopsy      X 2 left, benign  . Trigger finger release  2003    Right hand  . Hand surgery  02/10  . Back surgery  02/11    1995- decompression, then followed by fusion in 2011  . Tubal ligation     History  Substance Use Topics  . Smoking status: Current Every Day Smoker -- 0.50 packs/day for 50 years    Types: Cigarettes  . Smokeless tobacco: Not on file     Comment: only smokes E-cigarettes  . Alcohol Use: 0.0 oz/week    0 Standard drinks or equivalent per week     Comment: social/ wine- once q two weeks    Family History  Problem Relation Age of Onset  . Heart disease Father     MI  . Cancer Sister     uterine cancer    . Cancer Brother     colon  . Diabetes Brother   . Heart disease Brother     CAD  . Diabetes Brother   . Cancer Brother     prostate  . Diabetes  Brother    Allergies  Allergen Reactions  . Alendronate Sodium Other (See Comments)    reflux  . Bupropion Hcl Palpitations     heart palpitations   Current Outpatient Prescriptions on File Prior to Visit  Medication Sig Dispense Refill  . Docusate Calcium (STOOL SOFTENER PO) Take 1-2 capsules by mouth 2 (two) times daily as needed (Constipation).    . gabapentin (NEURONTIN) 300 MG capsule TAKE TWO CAPSULES THREE TIMES DAILY 180 capsule 5  . meloxicam (MOBIC) 15 MG tablet Take 1 tablet by mouth daily.    . nortriptyline (PAMELOR) 10 MG capsule Take 1 tablet at bedtime x 2 week, then increase to 2 tablets at bedtime 60 capsule 5  . zolpidem (AMBIEN) 10 MG tablet TAKE ONE TABLET AT BEDTIME AS NEEDED FORSLEEP 30 tablet 5   No current facility-administered medications on file prior to visit.    Review of Systems Review of Systems  Constitutional: Negative for fever, appetite change, fatigue and unexpected weight change.  Eyes: Negative for pain and visual disturbance.  Respiratory: Negative for cough and shortness of breath.   Cardiovascular: Negative for cp or palpitations    Gastrointestinal: Negative for nausea, diarrhea and constipation.  Genitourinary: Negative for urgency and frequency.  Skin: Negative for pallor or rash   Neurological: Negative for weakness, light-headedness, headaches. pos for neuropathy in feet  Hematological: Negative for adenopathy. Does not bruise/bleed easily.  Psychiatric/Behavioral: Negative for dysphoric mood. The patient is not nervous/anxious.         Objective:   Physical Exam  Constitutional: She appears well-developed and well-nourished. No distress.  overwt and well appearing   HENT:  Head: Normocephalic and atraumatic.  Right Ear: External ear normal.  Left Ear: External ear normal.   Mouth/Throat: Oropharynx is clear and moist.  Eyes: Conjunctivae and EOM are normal. Pupils are equal, round, and reactive to light. No scleral icterus.  Neck: Normal range of motion. Neck supple. No JVD present. Carotid bruit is not present. No thyromegaly present.  Cardiovascular: Normal rate, regular rhythm, normal heart sounds and intact distal pulses.  Exam reveals no gallop.   Pulmonary/Chest: Effort normal and breath sounds normal. No respiratory distress. She has no wheezes. She exhibits no tenderness.  Diffusely distant bs   Abdominal: Soft. Bowel sounds are normal. She exhibits no distension, no abdominal bruit and no mass. There is no tenderness.  Genitourinary: No breast swelling, tenderness, discharge or bleeding.  Breast exam: No mass, nodules, thickening, tenderness, bulging, retraction, inflamation, nipple discharge or skin changes noted.  No axillary or clavicular LA.      Musculoskeletal: Normal range of motion. She exhibits no edema or tenderness.  Lymphadenopathy:    She has no cervical adenopathy.  Neurological: She is alert. She has normal reflexes. No cranial nerve deficit. She exhibits normal muscle tone. Coordination normal.  Skin: Skin is warm and dry. No rash noted. No erythema. No pallor.  Psychiatric: She has a normal mood and affect.          Assessment & Plan:   Problem List Items Addressed This Visit      Digestive   B12 deficiency - Primary    Lab Results  Component Value Date   VITAMINB12 255 08/18/2014   B12 shot today Continue oral supplementation      Relevant Medications   cyanocobalamin ((VITAMIN B-12)) injection 1,000 mcg (Completed)     Musculoskeletal and Integument   Osteopenia    Disc need for calcium/ vitamin D/  wt bearing exercise and bone density test every 2 y to monitor Disc safety/ fracture risk in detail  She declines another dexa at this time  Intol of fosamax in the past          Other   Encounter for Medicare  annual wellness exam    Reviewed health habits including diet and exercise and skin cancer prevention Reviewed appropriate screening tests for age  Also reviewed health mt list, fam hx and immunization status , as well as social and family history   See HPI Labs reviewed Work on low sugar low fat diet  B12 shot today  prevnar vaccine today Don't forget to schedule your annual mammogram Please work on an Proofreader  Increase your vitamin D3 over the counter to 4000 iu daily  Keep working on quitting smoking - e cigs - moving towards nicotine free       Hyperglycemia    Lab Results  Component Value Date   HGBA1C 6.0 08/18/2014   Also had 3h GTT that was positive ? If this adds to her neuropathy  Will begin metformin 250 mg bid  Disc poss side eff-inst to update F/u 3 mo with lab prior Rev low glycemic diet and exercise       Nicotine vapor product user    Pt uses e cigs Commended on stopping cigarettes  Will work towards getting nicotine free      Vitamin D deficiency    D level is not at goal  Will inc D3 to 4000 iu daily  Disc imp to bone and overall health       Other Visit Diagnoses    Need for prophylactic vaccination against Streptococcus pneumoniae (pneumococcus)

## 2014-09-08 NOTE — Patient Instructions (Signed)
Start metformin 250 mg twice daily with meals - if any side effects let me know  Work on low sugar low fat diet  B12 shot today  prevnar vaccine today Don't forget to schedule your annual mammogram Please work on an ProofreaderAdvance Directive  Increase your vitamin D3 over the counter to 4000 iu daily  Keep working on quitting smoking - e cigs - moving towards nicotine free

## 2014-09-08 NOTE — Progress Notes (Signed)
Pre visit review using our clinic review tool, if applicable. No additional management support is needed unless otherwise documented below in the visit note. 

## 2014-09-09 NOTE — Assessment & Plan Note (Signed)
Pt uses e cigs Commended on stopping cigarettes  Will work towards getting nicotine free

## 2014-09-09 NOTE — Assessment & Plan Note (Signed)
Lab Results  Component Value Date   HGBA1C 6.0 08/18/2014   Also had 3h GTT that was positive ? If this adds to her neuropathy  Will begin metformin 250 mg bid  Disc poss side eff-inst to update F/u 3 mo with lab prior Rev low glycemic diet and exercise

## 2014-09-09 NOTE — Assessment & Plan Note (Signed)
Disc need for calcium/ vitamin D/ wt bearing exercise and bone density test every 2 y to monitor Disc safety/ fracture risk in detail  She declines another dexa at this time  Intol of fosamax in the past

## 2014-09-09 NOTE — Assessment & Plan Note (Signed)
D level is not at goal  Will inc D3 to 4000 iu daily  Disc imp to bone and overall health

## 2014-09-09 NOTE — Assessment & Plan Note (Signed)
Lab Results  Component Value Date   VITAMINB12 255 08/18/2014   B12 shot today Continue oral supplementation

## 2014-09-09 NOTE — Assessment & Plan Note (Signed)
Reviewed health habits including diet and exercise and skin cancer prevention Reviewed appropriate screening tests for age  Also reviewed health mt list, fam hx and immunization status , as well as social and family history   See HPI Labs reviewed Work on low sugar low fat diet  B12 shot today  prevnar vaccine today Don't forget to schedule your annual mammogram Please work on an ProofreaderAdvance Directive  Increase your vitamin D3 over the counter to 4000 iu daily  Keep working on quitting smoking - e cigs - moving towards nicotine free

## 2014-10-13 ENCOUNTER — Ambulatory Visit: Payer: Medicare Other | Admitting: Neurology

## 2014-11-24 ENCOUNTER — Other Ambulatory Visit (INDEPENDENT_AMBULATORY_CARE_PROVIDER_SITE_OTHER): Payer: Medicare Other

## 2014-11-24 DIAGNOSIS — R739 Hyperglycemia, unspecified: Secondary | ICD-10-CM

## 2014-11-24 LAB — HEMOGLOBIN A1C: HEMOGLOBIN A1C: 5.8 % (ref 4.6–6.5)

## 2014-11-27 ENCOUNTER — Telehealth: Payer: Self-pay

## 2014-11-27 NOTE — Telephone Encounter (Signed)
[facility]; prior auth for zolpidem approved 1 year as of 11/27/14. [facility] will receive letter in mail.

## 2014-11-27 NOTE — Telephone Encounter (Signed)
Pt's pharmacy notified and they will advise pt

## 2014-12-08 ENCOUNTER — Encounter: Payer: Self-pay | Admitting: Family Medicine

## 2014-12-08 ENCOUNTER — Ambulatory Visit (INDEPENDENT_AMBULATORY_CARE_PROVIDER_SITE_OTHER): Payer: Medicare Other | Admitting: Family Medicine

## 2014-12-08 VITALS — BP 138/70 | HR 83 | Temp 98.0°F | Ht 62.0 in | Wt 148.0 lb

## 2014-12-08 DIAGNOSIS — Z720 Tobacco use: Secondary | ICD-10-CM

## 2014-12-08 DIAGNOSIS — Z789 Other specified health status: Secondary | ICD-10-CM

## 2014-12-08 DIAGNOSIS — R739 Hyperglycemia, unspecified: Secondary | ICD-10-CM | POA: Diagnosis not present

## 2014-12-08 NOTE — Patient Instructions (Signed)
Blood pressure is improved on 2nd check  Do check it at home and keep a record - only check it when relaxed  Also start cutting down the amount of nicotine in your e cigarettes  Continue metformin  A1C and wt loss are both great  Be more active when you can   Follow up in about 6 months with labs prior

## 2014-12-08 NOTE — Progress Notes (Signed)
Pre visit review using our clinic review tool, if applicable. No additional management support is needed unless otherwise documented below in the visit note. 

## 2014-12-08 NOTE — Progress Notes (Signed)
Subjective:    Patient ID: Sandy Harper, female    DOB: 04/16/1941, 74 y.o.   MRN: [id number]  HPI Here for f/u of chronic medical problems  Has been doing pretty good  Had an episode with her back  Had an epidural and that helped     Wt is down 7 lb with bmi of 27 Has been really trying  Eating a diabetic diabetic diet now - challenging  Adj to it  Less bread and less starch   Had not been able to do a lot with back - about to start back walking again when cleared  nortriptilyne helps chronic leg pain and numbness as well    BP is elevated today-not usual for her  May start checking at home  BP Readings from Last 3 Encounters:  12/08/14 150/90  09/08/14 138/80  07/09/14 130/84   re check 138/70  Hyperglycemia Lab Results  Component Value Date   HGBA1C 5.8 11/24/2014   This is down from 6.0  On metformin 250 bid now  No side eff or problems   Nicotine - still using some nicotine (? How much)   Patient Active Problem List   Diagnosis Date Noted  . Screening for lipoid disorders 08/16/2014  . Left leg pain 02/20/2014  . Lumbar pain with radiation down left leg 02/20/2014  . B12 deficiency 02/18/2014  . Diverticulosis 06/30/2013  . History of motion sickness 06/30/2013  . Diverticulitis 04/08/2013  . Peripheral neuropathy 04/08/2013  . Encounter for Medicare annual wellness exam 09/24/2012  . Leg cramps 08/28/2011  . Other screening mammogram 09/19/2010  . HEMORRHOIDS, EXTERNAL 12/30/2009  . OVARIAN CYST 08/23/2009  . BACK PAIN, THORACIC REGION, RIGHT 03/03/2009  . Vitamin D deficiency 11/12/2007  . DEPRESSION 10/01/2007  . GERD 10/01/2007  . Hyperglycemia 10/01/2007  . CALCULUS, URINARY NOS 02/26/2007  . Nicotine vapor product user 01/14/2007  . FIBROCYSTIC BREAST DISEASE 01/14/2007  . VITILIGO 01/14/2007  . DEGENERATIVE JOINT DISEASE 01/14/2007  . Osteopenia 01/14/2007  . SLEEP DISORDER 01/14/2007  . INSOMNIA 01/14/2007  . MURMUR 01/14/2007    Past Medical History  Diagnosis Date  . Osteopenia   . Sleep disorder   . Kidney stones     With lithotripsy and stent  . Hyperlipidemia   . Hyperglycemia   . Depression   . Vitiligo   . Tobacco abuse   . Urine WBC increased 2005    Urology work-normal  . Heart murmur     echocardiogram- 10 yrs. ago- wnl  . Heart palpitations 2001    due to stress - pt. feels that it has resolved & it was related to stress at that time  . Degenerative joint disease     lumbar region & L hand - cortisone injection in past    Past Surgical History  Procedure Laterality Date  . Carpal tunnel release      bilateral  . Hemorrhoid surgery    . Oophorectomy      Left cyst  . Breast biopsy      X 2 left, benign  . Trigger finger release  2003    Right hand  . Hand surgery  02/10  . Back surgery  02/11    1995- decompression, then followed by fusion in 2011  . Tubal ligation     History  Substance Use Topics  . Smoking status: Current Every Day Smoker -- 0.50 packs/day for 50 years    Types: Cigarettes  . Smokeless  tobacco: Not on file     Comment: only smokes E-cigarettes  . Alcohol Use: 0.0 oz/week    0 Standard drinks or equivalent per week     Comment: social/ wine- once q two weeks    Family History  Problem Relation Age of Onset  . Heart disease Father     MI  . Cancer Sister     uterine cancer  . Cancer Brother     colon  . Diabetes Brother   . Heart disease Brother     CAD  . Diabetes Brother   . Cancer Brother     prostate  . Diabetes Brother    Allergies  Allergen Reactions  . Alendronate Sodium Other (See Comments)    reflux  . Bupropion Hcl Palpitations     heart palpitations   Current Outpatient Prescriptions on File Prior to Visit  Medication Sig Dispense Refill  . Docusate Calcium (STOOL SOFTENER PO) Take 1-2 capsules by mouth 2 (two) times daily as needed (Constipation).    . gabapentin (NEURONTIN) 300 MG capsule TAKE TWO CAPSULES THREE TIMES DAILY  180 capsule 5  . meloxicam (MOBIC) 15 MG tablet Take 1 tablet by mouth daily.    . metFORMIN (GLUCOPHAGE) 500 MG tablet Take 0.5 tablets (250 mg total) by mouth 2 (two) times daily with a meal. 30 tablet 11  . nortriptyline (PAMELOR) 10 MG capsule Take 1 tablet at bedtime x 2 week, then increase to 2 tablets at bedtime 60 capsule 5  . zolpidem (AMBIEN) 10 MG tablet TAKE ONE TABLET AT BEDTIME AS NEEDED FORSLEEP 30 tablet 5   No current facility-administered medications on file prior to visit.    Review of Systems    Review of Systems  Constitutional: Negative for fever, appetite change, fatigue and unexpected weight change.  Eyes: Negative for pain and visual disturbance.  Respiratory: Negative for cough and shortness of breath.   Cardiovascular: Negative for cp or palpitations    Gastrointestinal: Negative for nausea, diarrhea and constipation.  Genitourinary: Negative for urgency and frequency.  Skin: Negative for pallor or rash   MSK pos for back and leg pain that have improved  Neurological: Negative for weakness, light-headedness, and headaches. pos for symptoms of neuropathy  Hematological: Negative for adenopathy. Does not bruise/bleed easily.  Psychiatric/Behavioral: Negative for dysphoric mood. The patient is not nervous/anxious.      Objective:   Physical Exam  Constitutional: She appears well-developed and well-nourished. No distress.  obese and well appearing   HENT:  Head: Normocephalic and atraumatic.  Mouth/Throat: Oropharynx is clear and moist.  Eyes: Conjunctivae and EOM are normal. Pupils are equal, round, and reactive to light.  Neck: Normal range of motion. Neck supple. No JVD present. Carotid bruit is not present. No thyromegaly present.  Cardiovascular: Normal rate, regular rhythm and intact distal pulses.  Exam reveals no gallop.   Murmur heard. Pulmonary/Chest: Effort normal and breath sounds normal. No respiratory distress. She has no wheezes. She has no  rales.  No crackles  Abdominal: Soft. Bowel sounds are normal. She exhibits no distension, no abdominal bruit and no mass. There is no tenderness.  Musculoskeletal: She exhibits no edema.  Lymphadenopathy:    She has no cervical adenopathy.  Neurological: She is alert. She has normal reflexes.  Skin: Skin is warm and dry. No rash noted.  Psychiatric: She has a normal mood and affect.  Nursing note and vitals reviewed.         Assessment &  Plan:   Problem List Items Addressed This Visit    Hyperglycemia - Primary    Lab Results  Component Value Date   HGBA1C 5.8 11/24/2014   Improved with 250 of metformin bid and DM diet Also lost 7 lb Will begin exercise when she can (with her back) -plans to start walking soon F/u 6 mo  Enc to keep up the good work      Nicotine vapor product user    Disc with pt - will try to gradually cut nicotine as tol until nicotine free

## 2014-12-08 NOTE — Assessment & Plan Note (Signed)
Lab Results  Component Value Date   HGBA1C 5.8 11/24/2014   Improved with 250 of metformin bid and DM diet Also lost 7 lb Will begin exercise when she can (with her back) -plans to start walking soon F/u 6 mo  Enc to keep up the good work

## 2014-12-08 NOTE — Assessment & Plan Note (Signed)
Disc with pt - will try to gradually cut nicotine as tol until nicotine free

## 2014-12-29 ENCOUNTER — Telehealth: Payer: Self-pay | Admitting: Neurology

## 2014-12-29 NOTE — Telephone Encounter (Signed)
Pt called to schedule appointment and said she is running out of her medication Nortriptyline and wanted to get a refill called in/Dawn CB# [phone]

## 2014-12-30 ENCOUNTER — Other Ambulatory Visit: Payer: Self-pay | Admitting: *Deleted

## 2014-12-30 MED ORDER — NORTRIPTYLINE HCL 10 MG PO CAPS: ORAL_CAPSULE | ORAL | Status: DC

## 2014-12-30 NOTE — Telephone Encounter (Signed)
Rx sent in

## 2015-01-13 ENCOUNTER — Encounter: Payer: Self-pay | Admitting: Neurology

## 2015-01-13 ENCOUNTER — Ambulatory Visit (INDEPENDENT_AMBULATORY_CARE_PROVIDER_SITE_OTHER): Payer: Medicare Other | Admitting: Neurology

## 2015-01-13 VITALS — BP 130/80 | HR 85 | Ht 62.0 in | Wt 149.4 lb

## 2015-01-13 DIAGNOSIS — G609 Hereditary and idiopathic neuropathy, unspecified: Secondary | ICD-10-CM

## 2015-01-13 DIAGNOSIS — E538 Deficiency of other specified B group vitamins: Secondary | ICD-10-CM | POA: Diagnosis not present

## 2015-01-13 DIAGNOSIS — G4762 Sleep related leg cramps: Secondary | ICD-10-CM | POA: Diagnosis not present

## 2015-01-13 NOTE — Patient Instructions (Addendum)
1.  Start vitamin B12 daily 2.  Continue gabapentin  twice daily 3.  Increase nortriptyline to  at bedtime for 2 weeks, if tolerating, increase further to  at bedtime.  If not, you may reduce back down to  bedtime 4.  Return to clinic as needed, but do call with an update in a month

## 2015-01-13 NOTE — Progress Notes (Signed)
Follow-up Visit   Date: 01/13/2015    Sandy Harper MRN: [id number] DOB: 1940-11-26   Interim History: Sandy Harper is a 74 y.o. right-handed Caucasian female with hyperlipidemia, osteopenia, depression, tobacco use, lumbar stenosis and spondylosis s/p L2-L3 decompression (2011) and L1-2 decompression (2014), bilateral CTS release and osteoarthritis returning to the clinic for follow-up of peripheral neuropathy.  The patient was accompanied to the clinic by self.   History of present illness: Starting around 2013, she developing numbness and tingling involving the toes which has slowly become constant and involved a greater area of the sole and dorsum of her feet. She feels that her feet are cold all the time. She denies any weakness. Her balance seems to be off and is worse when climbing stairs. She continues to walk independently and has not had any falls. She was started on gabapentin and takes 600mg  TID, but does not feel that it makes any difference. She has not tried any other medication for neuropathy. Prior work-up has been limited to serology testing for diabetes with HbA1c, thyroid disease, and vitamin B12 deficiency of which she was being supplemented with B12 injections. She has an extensive family history of diabetes and is concerned this may be contributing, but her lab tests have not disclosed diabetes. She had not had a glucose tolerance.   She had a long history of degenerative joint disease and lumbar stenosis which is being followed by Dr. Newell Coral. She had underwent lumbar decompression x 2 (L1-2 and L2-3) and currently denies any back problems.  UPDATE 01/13/2015:  She reports having improved feet pain by 50% after starting nortriptlyine 20mg  qhs.  She continues to take gabapentin 600mg  BID.  She had not had any falls and continues to walk independently, but has noticed mild problems with balance.  She was found to be prediabetic and is doing great managing her  sugars with her last HbA1c 5.8.  Lately, she has started to have problems with leg cramps especially at night time.  She also complains about easy bruising, which I explained is beyond my area of expertise.    Medications:  Current Outpatient Prescriptions on File Prior to Visit  Medication Sig Dispense Refill  . Docusate Calcium (STOOL SOFTENER PO) Take 1-2 capsules by mouth 2 (two) times daily as needed (Constipation).    . gabapentin (NEURONTIN) 300 MG capsule TAKE TWO CAPSULES THREE TIMES DAILY 180 capsule 5  . meloxicam (MOBIC) 15 MG tablet Take 1 tablet by mouth daily.    . metFORMIN (GLUCOPHAGE) 500 MG tablet Take 0.5 tablets (250 mg total) by mouth 2 (two) times daily with a meal. 30 tablet 11  . nortriptyline (PAMELOR) 10 MG capsule Take 2 tablets at bedtime. 60 capsule 5  . zolpidem (AMBIEN) 10 MG tablet TAKE ONE TABLET AT BEDTIME AS NEEDED FORSLEEP 30 tablet 5   No current facility-administered medications on file prior to visit.    Allergies:  Allergies  Allergen Reactions  . Alendronate Sodium Other (See Comments)    reflux  . Bupropion Hcl Palpitations     heart palpitations    Review of Systems:  CONSTITUTIONAL: No fevers, chills, night sweats, or weight loss.  EYES: No visual changes or eye pain ENT: No hearing changes.  No history of nose bleeds.   RESPIRATORY: No cough, wheezing and shortness of breath.   CARDIOVASCULAR: Negative for chest pain, and palpitations.   GI: Negative for abdominal discomfort, blood in stools or black stools.  No  recent change in bowel habits.   GU:  No history of incontinence.   MUSCLOSKELETAL: No history of joint pain or swelling.  No myalgias.   SKIN: Negative for lesions, rash, and itching.   +easy bruising ENDOCRINE: Negative for cold or heat intolerance, polydipsia or goiter.   PSYCH:  No depression or anxiety symptoms.   NEURO: As Above.   Vital Signs:  BP 130/80 mmHg  Pulse 85  Ht  (1.575 m)  Wt 149 lb 7 oz (67.784  kg)  BMI 27.33 kg/m2  SpO2 97%  LMP 05/30/1995  Neurological Exam: MENTAL STATUS including orientation to time, place, person, recent and remote memory, attention span and concentration, language, and fund of knowledge is normal.  Speech is not dysarthric.  CRANIAL NERVES:  Pupils equal round and reactive to light.  Face is symmetric.   MOTOR:  Motor strength is 5/5 in all extremities.   No pronator drift.  Tone is normal.    MSRs:  Right                                                                 Left brachioradialis 2+  brachioradialis 2+  biceps 2+  biceps 2+  triceps 2+  triceps 2+  patellar trace  patellar trace  ankle jerk 0  ankle jerk 0   SENSORY:  Vibration, pin prick, and temperature reduced at ankles bilaterally.  COORDINATION/GAIT:   Intact rapid alternating movements bilaterally.  Gait narrow based and stable.  Tandem gait intact.  Data: MRI lumbar spine 07/10/2011: Lumbar arachnoiditis is present, unchanged. Satisfactory fusion at L2-3. Progression of degenerative change and stenosis at L1-2. There is moderately severe stenosis at this level. Grade 1 anterior slip L3-4 with mild to moderate spinal stenosis Advanced disc degeneration at L4-5. Central and left-sided osteophyte with left foraminal encroachment and left lateral recess encroachment. This is unchanged.  Labs 07/09/2014:  Vitamin B1 12, vitamin B6 12.5, copper 123, SPEP/UPEP with IFE no M protein, ceruloplasmin 32, Glucose tolerance test positive [phone] Labs 08/18/2014:  Vitamin B12 255, HbA1c 6.0  IMPRESSION: 1.  Peripheral neuropathy, likely idiopathic but she also has low vitamin B12 and prediabetes 2.  Nocturnal leg cramps 3.  Low vitamin B12 4.  Prediabetes, doing remarkably well with diet and low dose metformin 5.  Easy bruising, recommend f/u with PCP   PLAN/RECOMMENDATIONS:  1.  Start vitamin B12 daily 2.  Continue gabapentin  twice daily 3.  Increase nortriptyline  to  at bedtime for 2 weeks, if tolerating, increase further to  at bedtime.  If not, she may reduce back down to  qhs 4.  Start doing leg stretches at night and stay well hydrated.  Recommended 2-3 glasses of tonic water at nighttime 5.  Return to clinic as needed, but do call with an update in a month  The duration of this appointment visit was 25 minutes of face-to-face time with the patient.  Greater than 50% of this time was spent in counseling, explanation of diagnosis, planning of further management, and coordination of care.   Thank you for allowing me to participate in patient's care.  If I can answer any additional questions, I would be pleased to do so.    Sincerely,  Nazaret Chea K. Witten Certain, DO    

## 2015-02-22 ENCOUNTER — Telehealth: Payer: Self-pay | Admitting: *Deleted

## 2015-02-22 NOTE — Telephone Encounter (Signed)
Please have her stay at nortriptyline  and increase gabapentin to  three times daily (increase by  every 3 days).  If it makes her too sleepy, stay at lower dose.  She may need f/u visit to discuss other options.  Donika K. Allena Katz, DO

## 2015-02-22 NOTE — Telephone Encounter (Signed)
Patient called about nortriptyline being increased.  She tried going up to 30 mg but could not do it.   She did increase gabapentin to 300 mg 6 x a day.  She is now having pain in her feet.  Please advise.

## 2015-02-23 ENCOUNTER — Telehealth: Payer: Self-pay | Admitting: Neurology

## 2015-02-23 NOTE — Telephone Encounter (Signed)
Patient given instructions

## 2015-02-23 NOTE — Telephone Encounter (Signed)
VM-PT said she was returning your call/Dawn CB# [phone]

## 2015-02-23 NOTE — Telephone Encounter (Signed)
See previous note

## 2015-02-23 NOTE — Telephone Encounter (Signed)
Left message for patient to call me back. 

## 2015-03-04 ENCOUNTER — Other Ambulatory Visit: Payer: Self-pay | Admitting: Family Medicine

## 2015-03-05 NOTE — Telephone Encounter (Signed)
Electronic refill request, last OV was 12/08/14, last refilled on 09/01/14 #30 with 5 additional refills, please advise

## 2015-03-05 NOTE — Telephone Encounter (Signed)
Rx called in as prescribed 

## 2015-03-05 NOTE — Telephone Encounter (Signed)
Px written for call in   

## 2015-03-10 ENCOUNTER — Telehealth: Payer: Self-pay | Admitting: Family Medicine

## 2015-03-10 ENCOUNTER — Telehealth: Payer: Self-pay | Admitting: Neurology

## 2015-03-10 NOTE — Telephone Encounter (Signed)
PT called and said her foot was still getting worse, can't put weight on foot now, medication not working/Dawn CB#[phone] or [phone]

## 2015-03-10 NOTE — Telephone Encounter (Signed)
Pt would like to have return phone call  - please return call to - [phone] Thank you

## 2015-03-10 NOTE — Telephone Encounter (Signed)
Please have her see me or Dr Patsy Lageropland - it sounds like a new issue with her foot

## 2015-03-10 NOTE — Telephone Encounter (Signed)
See Dr. Eliane DecreePatel's phone note  Called pt back and she let me know that she has developed a new type of foot pain that feels different then her regular foot neuropathy, pt said usually her foot neuropathy is more cold, numbness, and tingling but for the last 2 weeks she has been having left heal pain. Pt said Dr. Allena KatzPatel advise her to increase her gabapentin and she did but it's not helping, pt said the pain has gotten to the point she can hardly walk. Pt said she called Dr. Eliane DecreePatel's office and they advise her she is out of town for the of the week and they advise her that she should call us. Pt not sure if this is the progression of her neuropathy or a new issue, she is willing to do what ever you want (either f/u with you or referral to a podiatrist or what ever you recommend), please advise

## 2015-03-10 NOTE — Telephone Encounter (Signed)
Called patient back and she said that her foot is hurting in her heel.  Dr. Allena KatzPatel had increased her neurontin but it is not helping.  Instructed her to call her PCP since Dr. Allena KatzPatel is out of the office until Monday.  Her PCP may be able to call in something for the pain or refer her to podiatry.

## 2015-03-11 NOTE — Telephone Encounter (Signed)
Pt declined to schedule an appt with Dr. Patsy Lageropland she wanted to see Dr. Milinda Antisower, appt scheduled for Friday with Dr. Milinda Antisower

## 2015-03-12 ENCOUNTER — Ambulatory Visit (INDEPENDENT_AMBULATORY_CARE_PROVIDER_SITE_OTHER): Payer: Medicare Other | Admitting: Family Medicine

## 2015-03-12 ENCOUNTER — Encounter: Payer: Self-pay | Admitting: Family Medicine

## 2015-03-12 VITALS — BP 152/86 | HR 92 | Temp 98.1°F | Ht 62.0 in | Wt 152.5 lb

## 2015-03-12 DIAGNOSIS — M722 Plantar fascial fibromatosis: Secondary | ICD-10-CM

## 2015-03-12 NOTE — Progress Notes (Signed)
Pre visit review using our clinic review tool, if applicable. No additional management support is needed unless otherwise documented below in the visit note. 

## 2015-03-12 NOTE — Progress Notes (Signed)
Subjective:    Patient ID: Sandy Harper, female    DOB: 1940-10-07, 74 y.o.   MRN: [id number]  HPI  Here with L foot pain  She was afraid it was rel to neuropathy   It is improving -last 2 days   Started 2-3 wk ago  Started to hurt to walk on her foot  Especially in her heel  Got much worse mon and tues - even some pain when not standing on it Is the worst when she initiates activity after rest  Then it warms up a bit   No trauma or injury Goes to a gentle yoga class  She does take mobic 15 mg daily   No swelling or redness    Dr Allena Katz had her inc her gabapentin -did not help  Patient Active Problem List   Diagnosis Date Noted  . Screening for lipoid disorders 08/16/2014  . Left leg pain 02/20/2014  . Lumbar pain with radiation down left leg 02/20/2014  . B12 deficiency 02/18/2014  . Diverticulosis 06/30/2013  . History of motion sickness 06/30/2013  . Diverticulitis 04/08/2013  . Peripheral neuropathy (HCC) 04/08/2013  . Encounter for Medicare annual wellness exam 09/24/2012  . Leg cramps 08/28/2011  . Other screening mammogram 09/19/2010  . HEMORRHOIDS, EXTERNAL 12/30/2009  . OVARIAN CYST 08/23/2009  . BACK PAIN, THORACIC REGION, RIGHT 03/03/2009  . Vitamin D deficiency 11/12/2007  . DEPRESSION 10/01/2007  . GERD 10/01/2007  . Hyperglycemia 10/01/2007  . CALCULUS, URINARY NOS 02/26/2007  . Nicotine vapor product user 01/14/2007  . FIBROCYSTIC BREAST DISEASE 01/14/2007  . VITILIGO 01/14/2007  . DEGENERATIVE JOINT DISEASE 01/14/2007  . Osteopenia 01/14/2007  . SLEEP DISORDER 01/14/2007  . INSOMNIA 01/14/2007  . MURMUR 01/14/2007   Past Medical History  Diagnosis Date  . Osteopenia   . Sleep disorder   . Kidney stones     With lithotripsy and stent  . Hyperlipidemia   . Hyperglycemia   . Depression   . Vitiligo   . Tobacco abuse   . Urine WBC increased 2005    Urology work-normal  . Heart murmur     echocardiogram- 10 yrs. ago- wnl  . Heart  palpitations 2001    due to stress - pt. feels that it has resolved & it was related to stress at that time  . Degenerative joint disease     lumbar region & L hand - cortisone injection in past    Past Surgical History  Procedure Laterality Date  . Carpal tunnel release      bilateral  . Hemorrhoid surgery    . Oophorectomy      Left cyst  . Breast biopsy      X 2 left, benign  . Trigger finger release  2003    Right hand  . Hand surgery  02/10  . Back surgery  02/11    1995- decompression, then followed by fusion in 2011  . Tubal ligation     Social History  Substance Use Topics  . Smoking status: Current Every Day Smoker -- 0.50 packs/day for 50 years    Types: Cigarettes  . Smokeless tobacco: None     Comment: only smokes E-cigarettes  . Alcohol Use: 0.0 oz/week    0 Standard drinks or equivalent per week     Comment: social/ wine- once q two weeks    Family History  Problem Relation Age of Onset  . Heart disease Father     MI  .  Cancer Sister     uterine cancer  . Cancer Brother     colon  . Diabetes Brother   . Heart disease Brother     CAD  . Diabetes Brother   . Cancer Brother     prostate  . Diabetes Brother    Allergies  Allergen Reactions  . Alendronate Sodium Other (See Comments)    reflux  . Bupropion Hcl Palpitations     heart palpitations   Current Outpatient Prescriptions on File Prior to Visit  Medication Sig Dispense Refill  . Docusate Calcium (STOOL SOFTENER PO) Take 1-2 capsules by mouth 2 (two) times daily as needed (Constipation).    . gabapentin (NEURONTIN) 300 MG capsule TAKE TWO CAPSULES THREE TIMES DAILY (Patient taking differently: three caps three times daily) 180 capsule 5  . meloxicam (MOBIC) 15 MG tablet Take 1 tablet by mouth daily.    . meloxicam (MOBIC) 15 MG tablet Take by mouth.    . metFORMIN (GLUCOPHAGE) 500 MG tablet Take 0.5 tablets (250 mg total) by mouth 2 (two) times daily with a meal. 30 tablet 11  .  nortriptyline (PAMELOR) 10 MG capsule Take 2 tablets at bedtime. 60 capsule 5  . zolpidem (AMBIEN) 10 MG tablet TAKE ONE TABLET AT BEDTIME AS NEEDED FORSLEEP 30 tablet 5   No current facility-administered medications on file prior to visit.    Review of Systems Review of Systems  Constitutional: Negative for fever, appetite change, fatigue and unexpected weight change.  Eyes: Negative for pain and visual disturbance.  Respiratory: Negative for cough and shortness of breath.   Cardiovascular: Negative for cp or palpitations    Gastrointestinal: Negative for nausea, diarrhea and constipation.  Genitourinary: Negative for urgency and frequency.  Skin: Negative for pallor or rash   MSK pos for L foot pain without swelling  Neurological: Negative for weakness, light-headedness, and headaches. pos for numbness in feet from neuropathy Hematological: Negative for adenopathy. Does not bruise/bleed easily.  Psychiatric/Behavioral: Negative for dysphoric mood. The patient is not nervous/anxious.         Objective:   Physical Exam  Constitutional: She appears well-developed and well-nourished. No distress.  obese and well appearing   Neck: Normal range of motion. Neck supple.  Cardiovascular: Normal rate and regular rhythm.   Murmur heard. Pulmonary/Chest: Effort normal and breath sounds normal.  Musculoskeletal: She exhibits tenderness. She exhibits no edema.       Left foot: There is tenderness. There is normal range of motion, no bony tenderness, no swelling, normal capillary refill, no crepitus, no deformity and no laceration.  Tender over proximal calcaneous and the post arch of foot  No swelling or skin change  No metatarsal or achilles tenderness Nl rom foot  Gait favors the R foot   Neurological: She is alert. She has normal reflexes. She displays no atrophy.  Baseline periph neuropathy in feet  Skin: Skin is warm and dry. No rash noted. No erythema.  Psychiatric: She has a normal  mood and affect.          Assessment & Plan:   Problem List Items Addressed This Visit      Musculoskeletal and Integument   Plantar fasciitis of left foot - Primary    L foot - heel pain worse when starting activity  Doubt rel to her neuropathy -can drop back on gabapentin  Disc use of hard soled shoe and frozen bottle for admin of cold and massage No barefoot at all  meloxicam  Update if no improvement in 2 wk

## 2015-03-12 NOTE — Patient Instructions (Addendum)
I think you have plantar fasciitis in your left foot You can cut back to previous dose of gabapentin  Continue meloxicam  Wear shoes whenever you are on your feet  (supportive one with a firm sole) Slippers are ok when you are off your feet  Put shoes by the bed - do not get up without them Try using the frozen water bottle and roll your foot over it for 5 minutes every time you get a chance If not improving in 2 weeks - please make an appointment with Dr Patsy Lageropland across the hall    Please follow up in mid January with labs prior

## 2015-03-14 NOTE — Assessment & Plan Note (Signed)
L foot - heel pain worse when starting activity  Doubt rel to her neuropathy -can drop back on gabapentin  Disc use of hard soled shoe and frozen bottle for admin of cold and massage No barefoot at all  meloxicam  Update if no improvement in 2 wk

## 2015-04-05 LAB — HM DIABETES EYE EXAM

## 2015-04-06 ENCOUNTER — Encounter: Payer: Self-pay | Admitting: Family Medicine

## 2015-04-14 ENCOUNTER — Encounter: Payer: Self-pay | Admitting: Family Medicine

## 2015-04-14 ENCOUNTER — Ambulatory Visit (INDEPENDENT_AMBULATORY_CARE_PROVIDER_SITE_OTHER)
Admission: RE | Admit: 2015-04-14 | Discharge: 2015-04-14 | Disposition: A | Discharge status: Home / Self Care | Payer: Medicare Other | Source: Ambulatory Visit | Attending: Family Medicine | Admitting: Family Medicine

## 2015-04-14 ENCOUNTER — Ambulatory Visit (INDEPENDENT_AMBULATORY_CARE_PROVIDER_SITE_OTHER): Payer: Medicare Other | Admitting: Family Medicine

## 2015-04-14 VITALS — BP 147/86 | HR 86 | Temp 98.4°F | Ht 62.0 in | Wt 152.5 lb

## 2015-04-14 DIAGNOSIS — M79672 Pain in left foot: Secondary | ICD-10-CM

## 2015-04-14 MED ORDER — TRAMADOL HCL 50 MG PO TABS: 50.0000 mg | ORAL_TABLET | Freq: Four times a day (QID) | ORAL | Status: DC | PRN

## 2015-04-14 NOTE — Patient Instructions (Signed)
Stress reaction or early stress fracture of the calcaneous (Heel bone)  Use walker when getting around - partial weightbearing

## 2015-04-14 NOTE — Progress Notes (Signed)
Dr. Karleen HampshireSpencer T. Zoye Chandra, MD, CAQ Sports Medicine [facility] and Sports Medicine [address] [place], [id number] Phone: [phone] Fax: [phone]  04/14/2015  Patient: Sandy Harper, MRN: [id number], DOB: 04/25/1941, 74 y.o.  Primary Physician:  Roxy MannsMarne Tower, MD   Chief Complaint  Patient presents with  . Foot Pain    Left x 5 to 6 weeks   Subjective:   Sandy Harper is a 74 y.o. very pleasant female patient who presents with the following:  Saw Dr. Milinda Antisower a few weeks ago, and she has neuropathy. All around the heel - excruciating. All on the left side - severe heel pain. At baseline she has some balance disturbance and neuropathy.  She has been having some heel pain for about the last 4 weeks, and she saw my partner about 2 weeks ago who thought that she had plantar fasciitis.  She referred her to me for further opinion. She tells me that she has pain at baseline and pain at nighttime you and she is not on her foot.  It is hurting now just sitting in the office without weightbearing.  She is having difficulty weightbearing at all.  Past Medical History, Surgical History, Social History, Family History, Problem List, Medications, and Allergies have been reviewed and updated if relevant.  Patient Active Problem List   Diagnosis Date Noted  . Plantar fasciitis of left foot 03/12/2015  . Screening for lipoid disorders 08/16/2014  . Left leg pain 02/20/2014  . Lumbar pain with radiation down left leg 02/20/2014  . B12 deficiency 02/18/2014  . Diverticulosis 06/30/2013  . History of motion sickness 06/30/2013  . Diverticulitis 04/08/2013  . Peripheral neuropathy (HCC) 04/08/2013  . Encounter for Medicare annual wellness exam 09/24/2012  . Leg cramps 08/28/2011  . Other screening mammogram 09/19/2010  . HEMORRHOIDS, EXTERNAL 12/30/2009  . OVARIAN CYST 08/23/2009  . BACK PAIN, THORACIC REGION, RIGHT 03/03/2009  . Vitamin D deficiency 11/12/2007  . DEPRESSION  10/01/2007  . GERD 10/01/2007  . Hyperglycemia 10/01/2007  . CALCULUS, URINARY NOS 02/26/2007  . Nicotine vapor product user 01/14/2007  . FIBROCYSTIC BREAST DISEASE 01/14/2007  . VITILIGO 01/14/2007  . DEGENERATIVE JOINT DISEASE 01/14/2007  . Osteopenia 01/14/2007  . SLEEP DISORDER 01/14/2007  . INSOMNIA 01/14/2007  . MURMUR 01/14/2007    Past Medical History  Diagnosis Date  . Osteopenia   . Sleep disorder   . Kidney stones     With lithotripsy and stent  . Hyperlipidemia   . Hyperglycemia   . Depression   . Vitiligo   . Tobacco abuse   . Urine WBC increased 2005    Urology work-normal  . Heart murmur     echocardiogram- 10 yrs. ago- wnl  . Heart palpitations 2001    due to stress - pt. feels that it has resolved & it was related to stress at that time  . Degenerative joint disease     lumbar region & L hand - cortisone injection in past     Past Surgical History  Procedure Laterality Date  . Carpal tunnel release      bilateral  . Hemorrhoid surgery    . Oophorectomy      Left cyst  . Breast biopsy      X 2 left, benign  . Trigger finger release  2003    Right hand  . Hand surgery  02/10  . Back surgery  02/11    1995- decompression, then followed by  fusion in 2011  . Tubal ligation      Social History   Social History  . Marital Status: Married    Spouse Name: N/A  . Number of Children: 2  . Years of Education: N/A   Occupational History  . Not on file.   Social History Main Topics  . Smoking status: Current Every Day Smoker -- 0.50 packs/day for 50 years    Types: Cigarettes  . Smokeless tobacco: Not on file     Comment: only smokes E-cigarettes  . Alcohol Use: 0.0 oz/week    0 Standard drinks or equivalent per week     Comment: social/ wine- once q two weeks   . Drug Use: No  . Sexual Activity: Not on file   Other Topics Concern  . Not on file   Social History Narrative   Lives with husband in a one story home.  Has 3 grown children.   Education: high school.  Mostly a home maker.     Family History  Problem Relation Age of Onset  . Heart disease Father     MI  . Cancer Sister     uterine cancer  . Cancer Brother     colon  . Diabetes Brother   . Heart disease Brother     CAD  . Diabetes Brother   . Cancer Brother     prostate  . Diabetes Brother     Allergies  Allergen Reactions  . Alendronate Sodium Other (See Comments)    reflux  . Bupropion Hcl Palpitations     heart palpitations    Medication list reviewed and updated in full in [facility].  GEN: No fevers, chills. Nontoxic. Primarily MSK c/o today. MSK: Detailed in the HPI GI: tolerating PO intake without difficulty Neuro: No numbness, parasthesias, or tingling associated. Otherwise the pertinent positives of the ROS are noted above.   Objective:   BP 147/86 mmHg  Pulse 86  Temp(Src) 98.4 F (36.9 C) (Oral)  Ht  (1.575 m)  Wt 152 lb 8 oz (69.174 kg)  BMI 27.89 kg/m2  LMP 05/30/1995   GEN: WDWN, NAD, Non-toxic, Alert & Oriented x 3 HEENT: Atraumatic, Normocephalic.  Ears and Nose: No external deformity. EXTR: No clubbing/cyanosis/edema PSYCH: Normally interactive. Conversant. Not depressed or anxious appearing.  Calm demeanor.   FEET: L Echymosis: no Edema: no ROM: full LE B Gait: heel toe, markedly antalgic MT pain: no Callus pattern: none Lateral Mall: NT Medial Mall: NT Talus: NT Navicular: NT Cuboid: NT Calcaneous: TTP medially and laterally, squeeze is positive Metatarsals: NT 5th MT: NT Phalanges: NT Achilles: NT Plantar Fascia: plantar aspect of calcaneous as well Fat Pad: NT Peroneals: NT Post Tib: NT Great Toe: Nml motion Ant Drawer: neg ATFL: NT CFL: NT Deltoid: NT Sensation: intact   Radiology: Dg Os Calcis Left  04/14/2015  CLINICAL DATA:  Left heel pain for the past 2 months, patient has been doing yellow low but recalls no trauma. EXAM: LEFT OS CALCIS - 2+ VIEW COMPARISON:  Left foot  dated July 04, 2013 FINDINGS: The calcaneus is adequately mineralized. There is a large plantar calcaneal spur. Soft tissue calcification inferior and anterior to the spur is present and more conspicuous than in the past. The calcaneus exhibits no evidence of an acute fracture or stress type reaction. The talus is intact. The visualized portions of the other tarsal bones and the metatarsal bases on the lateral image exhibit no acute abnormalities. IMPRESSION:  Large plantar calcaneal spur. Additional calcification within the posterior aspect of the plantar fascia is seen and may reflect plantar fasciitis. Electronically Signed   By: David  [place] M.D.   On: 04/14/2015 11:13    Assessment and Plan:   Heel pain, left - Plan: DG Os Calcis Left  Given exam, classic location for calcaneal stress fracture will treat as such.   Walker at home, so she can be partial weight bearing and i placed her in a short pneumatic fracture boot. Due to balance and neuropathy, full NWB was felt to be inappropriate and increase risk of additional injury.  Follow-up: Return in about 2 weeks (around 04/28/2015).  New Prescriptions   TRAMADOL (ULTRAM) 50 MG TABLET    Take 1 tablet (50 mg total) by mouth every 6 (six) hours as needed.   Orders Placed This Encounter  Procedures  . DG Os Calcis Left    Signed,  Rainey Rodger T. Makensey Rego, MD   Patient's Medications  New Prescriptions   TRAMADOL (ULTRAM) 50 MG TABLET    Take 1 tablet (50 mg total) by mouth every 6 (six) hours as needed.  Previous Medications   DOCUSATE CALCIUM (STOOL SOFTENER PO)    Take 1-2 capsules by mouth 2 (two) times daily as needed (Constipation).   GABAPENTIN (NEURONTIN) 300 MG CAPSULE    TAKE TWO CAPSULES THREE TIMES DAILY   MELOXICAM (MOBIC) 15 MG TABLET    Take 1 tablet by mouth daily.   MELOXICAM (MOBIC) 15 MG TABLET    Take by mouth.   METFORMIN (GLUCOPHAGE) 500 MG TABLET    Take 0.5 tablets (250 mg total) by mouth 2 (two) times daily  with a meal.   NORTRIPTYLINE (PAMELOR) 10 MG CAPSULE    Take 2 tablets at bedtime.   ZOLPIDEM (AMBIEN) 10 MG TABLET    TAKE ONE TABLET AT BEDTIME AS NEEDED FORSLEEP  Modified Medications   No medications on file  Discontinued Medications   No medications on file

## 2015-04-14 NOTE — Progress Notes (Signed)
Pre visit review using our clinic review tool, if applicable. No additional management support is needed unless otherwise documented below in the visit note. 

## 2015-04-28 ENCOUNTER — Ambulatory Visit (INDEPENDENT_AMBULATORY_CARE_PROVIDER_SITE_OTHER): Payer: Medicare Other | Admitting: Family Medicine

## 2015-04-28 ENCOUNTER — Encounter: Payer: Self-pay | Admitting: Family Medicine

## 2015-04-28 VITALS — BP 140/64 | HR 93 | Temp 98.3°F | Ht 62.0 in | Wt 153.0 lb

## 2015-04-28 DIAGNOSIS — M79672 Pain in left foot: Secondary | ICD-10-CM

## 2015-04-28 NOTE — Progress Notes (Signed)
Dr. Karleen Hampshire T. Yukio Bisping, MD, CAQ Sports Medicine [facility] and Sports Medicine [address] [place], [address] Phone: [phone] Fax: [phone]  04/28/2015  Patient: Sandy Harper, MRN: [id number], DOB: 07-17-1940, 74 y.o.  Primary Physician:  Roxy Manns, MD   Chief Complaint  Patient presents with  . Follow-up    Left Foot   Subjective:   Sandy Harper is a 74 y.o. very pleasant female patient who presents with the following:  Doing great, minimal pain now Very compliant with wearing her CAM walker boot Walking normally now even out of her cam walker  Titrate out of boot  04/14/2015 Last OV with Hannah Beat, MD  Saw Dr. Milinda Antis a few weeks ago, and she has neuropathy. All around the heel - excruciating. All on the left side - severe heel pain. At baseline she has some balance disturbance and neuropathy.  She has been having some heel pain for about the last 4 weeks, and she saw my partner about 2 weeks ago who thought that she had plantar fasciitis.  She referred her to me for further opinion. She tells me that she has pain at baseline and pain at nighttime you and she is not on her foot.  It is hurting now just sitting in the office without weightbearing.  She is having difficulty weightbearing at all.  Past Medical History, Surgical History, Social History, Family History, Problem List, Medications, and Allergies have been reviewed and updated if relevant.  Patient Active Problem List   Diagnosis Date Noted  . Plantar fasciitis of left foot 03/12/2015  . Screening for lipoid disorders 08/16/2014  . Left leg pain 02/20/2014  . Lumbar pain with radiation down left leg 02/20/2014  . B12 deficiency 02/18/2014  . Diverticulosis 06/30/2013  . History of motion sickness 06/30/2013  . Diverticulitis 04/08/2013  . Peripheral neuropathy (HCC) 04/08/2013  . Encounter for Medicare annual wellness exam 09/24/2012  . Leg cramps 08/28/2011  . Other screening  mammogram 09/19/2010  . HEMORRHOIDS, EXTERNAL 12/30/2009  . OVARIAN CYST 08/23/2009  . BACK PAIN, THORACIC REGION, RIGHT 03/03/2009  . Vitamin D deficiency 11/12/2007  . DEPRESSION 10/01/2007  . GERD 10/01/2007  . Hyperglycemia 10/01/2007  . CALCULUS, URINARY NOS 02/26/2007  . Nicotine vapor product user 01/14/2007  . FIBROCYSTIC BREAST DISEASE 01/14/2007  . VITILIGO 01/14/2007  . DEGENERATIVE JOINT DISEASE 01/14/2007  . Osteopenia 01/14/2007  . SLEEP DISORDER 01/14/2007  . INSOMNIA 01/14/2007  . MURMUR 01/14/2007    Past Medical History  Diagnosis Date  . Osteopenia   . Sleep disorder   . Kidney stones     With lithotripsy and stent  . Hyperlipidemia   . Hyperglycemia   . Depression   . Vitiligo   . Tobacco abuse   . Urine WBC increased 2005    Urology work-normal  . Heart murmur     echocardiogram- 10 yrs. ago- wnl  . Heart palpitations 2001    due to stress - pt. feels that it has resolved & it was related to stress at that time  . Degenerative joint disease     lumbar region & L hand - cortisone injection in past     Past Surgical History  Procedure Laterality Date  . Carpal tunnel release      bilateral  . Hemorrhoid surgery    . Oophorectomy      Left cyst  . Breast biopsy      X 2 left, benign  .  Trigger finger release  2003    Right hand  . Hand surgery  02/10  . Back surgery  02/11    1995- decompression, then followed by fusion in 2011  . Tubal ligation      Social History   Social History  . Marital Status: Married    Spouse Name: N/A  . Number of Children: 2  . Years of Education: N/A   Occupational History  . Not on file.   Social History Main Topics  . Smoking status: Current Every Day Smoker -- 0.50 packs/day for 50 years    Types: Cigarettes  . Smokeless tobacco: Never Used     Comment: only smokes E-cigarettes  . Alcohol Use: 0.0 oz/week    0 Standard drinks or equivalent per week     Comment: social/ wine- once q two weeks     . Drug Use: No  . Sexual Activity: Not on file   Other Topics Concern  . Not on file   Social History Narrative   Lives with husband in a one story home.  Has 3 grown children.  Education: high school.  Mostly a home maker.     Family History  Problem Relation Age of Onset  . Heart disease Father     MI  . Cancer Sister     uterine cancer  . Cancer Brother     colon  . Diabetes Brother   . Heart disease Brother     CAD  . Diabetes Brother   . Cancer Brother     prostate  . Diabetes Brother     Allergies  Allergen Reactions  . Alendronate Sodium Other (See Comments)    reflux  . Bupropion Hcl Palpitations     heart palpitations    Medication list reviewed and updated in full in [facility].  GEN: No fevers, chills. Nontoxic. Primarily MSK c/o today. MSK: Detailed in the HPI GI: tolerating PO intake without difficulty Neuro: No numbness, parasthesias, or tingling associated. Otherwise the pertinent positives of the ROS are noted above.   Objective:   BP 140/64 mmHg  Pulse 93  Temp(Src) 98.3 F (36.8 C) (Oral)  Ht  (1.575 m)  Wt 153 lb (69.4 kg)  BMI 27.98 kg/m2  LMP 05/30/1995   GEN: WDWN, NAD, Non-toxic, Alert & Oriented x 3 HEENT: Atraumatic, Normocephalic.  Ears and Nose: No external deformity. EXTR: No clubbing/cyanosis/edema PSYCH: Normally interactive. Conversant. Not depressed or anxious appearing.  Calm demeanor.   FEET: L Echymosis: no Edema: no ROM: full LE B Gait: heel toe, without antalgia MT pain: no Callus pattern: none Lateral Mall: NT Medial Mall: NT Talus: NT Navicular: NT Cuboid: NT Calcaneous: NT Metatarsals: NT 5th MT: NT Phalanges: NT Achilles: NT Plantar Fascia: NT Fat Pad: NT Peroneals: NT Post Tib: NT Great Toe: Nml motion Ant Drawer: neg ATFL: NT CFL: NT Deltoid: NT Sensation: intact   Radiology: Dg Os Calcis Left  04/14/2015  CLINICAL DATA:  Left heel pain for the past 2 months, patient has  been doing yellow low but recalls no trauma. EXAM: LEFT OS CALCIS - 2+ VIEW COMPARISON:  Left foot dated July 04, 2013 FINDINGS: The calcaneus is adequately mineralized. There is a large plantar calcaneal spur. Soft tissue calcification inferior and anterior to the spur is present and more conspicuous than in the past. The calcaneus exhibits no evidence of an acute fracture or stress type reaction. The talus is intact. The visualized portions of  the other tarsal bones and the metatarsal bases on the lateral image exhibit no acute abnormalities. IMPRESSION: Large plantar calcaneal spur. Additional calcification within the posterior aspect of the plantar fascia is seen and may reflect plantar fasciitis. Electronically Signed   By: David  Swaziland[place] M.D.   On: 04/14/2015 11:13    Assessment and Plan:   Heel pain, left  Doing very well, titrate out of CAM walker in the next 7-10 days Progress as tolerated  Follow-up: prn  Signed,  Greydis Stlouis T. Lydie Stammen, MD   Patient's Medications  New Prescriptions   No medications on file  Previous Medications   DOCUSATE CALCIUM (STOOL SOFTENER PO)    Take 1-2 capsules by mouth 2 (two) times daily as needed (Constipation).   GABAPENTIN (NEURONTIN) 300 MG CAPSULE    TAKE TWO CAPSULES THREE TIMES DAILY   MELOXICAM (MOBIC) 15 MG TABLET    Take 1 tablet by mouth daily.   METFORMIN (GLUCOPHAGE) 500 MG TABLET    Take 0.5 tablets (250 mg total) by mouth 2 (two) times daily with a meal.   NORTRIPTYLINE (PAMELOR) 10 MG CAPSULE    Take 2 tablets at bedtime.   TRAMADOL (ULTRAM) 50 MG TABLET    Take 1 tablet (50 mg total) by mouth every 6 (six) hours as needed.   ZOLPIDEM (AMBIEN) 10 MG TABLET    TAKE ONE TABLET AT BEDTIME AS NEEDED FORSLEEP  Modified Medications   No medications on file  Discontinued Medications   MELOXICAM (MOBIC) 15 MG TABLET    Take by mouth.

## 2015-04-28 NOTE — Progress Notes (Signed)
Pre visit review using our clinic review tool, if applicable. No additional management support is needed unless otherwise documented below in the visit note. 

## 2015-06-01 ENCOUNTER — Other Ambulatory Visit: Payer: Self-pay | Admitting: Family Medicine

## 2015-06-01 NOTE — Telephone Encounter (Signed)
Last refilled on 09/01/14 #180 with 5 additional refill, please advise

## 2015-06-01 NOTE — Telephone Encounter (Signed)
Please refill for 6 mo  Thanks  

## 2015-06-02 NOTE — Telephone Encounter (Signed)
done

## 2015-07-12 ENCOUNTER — Telehealth: Payer: Self-pay | Admitting: Neurology

## 2015-07-12 NOTE — Telephone Encounter (Signed)
Patient notified

## 2015-07-12 NOTE — Telephone Encounter (Signed)
Patient called back for refill. Needs her  nortriptyline (PAMELOR) 10 MG capsule    Refilled. Pt takes it BID. Send to [facility] - [place], [place] - [address]

## 2015-07-12 NOTE — Telephone Encounter (Signed)
Rx called in to [facility]  #[phone] with 3 refills.

## 2015-07-12 NOTE — Telephone Encounter (Signed)
PT left message she needs a call back in regards to her medication/Dawn CB# [phone]

## 2015-07-30 ENCOUNTER — Other Ambulatory Visit (INDEPENDENT_AMBULATORY_CARE_PROVIDER_SITE_OTHER): Payer: Medicare Other

## 2015-07-30 DIAGNOSIS — R739 Hyperglycemia, unspecified: Secondary | ICD-10-CM

## 2015-07-30 LAB — COMPREHENSIVE METABOLIC PANEL
ALK PHOS: 73 U/L (ref 39–117)
ALT: 14 U/L (ref 0–35)
AST: 18 U/L (ref 0–37)
Albumin: 4.4 g/dL (ref 3.5–5.2)
BILIRUBIN TOTAL: 0.4 mg/dL (ref 0.2–1.2)
BUN: 15 mg/dL (ref 6–23)
CO2: 31 meq/L (ref 19–32)
Calcium: 9.5 mg/dL (ref 8.4–10.5)
Chloride: 104 mEq/L (ref 96–112)
Creatinine, Ser: 0.75 mg/dL (ref 0.40–1.20)
GFR: 80.27 mL/min (ref >60.00)
GLUCOSE: 133 mg/dL — AB (ref 70–99)
Potassium: 4.1 mEq/L (ref 3.5–5.1)
SODIUM: 141 meq/L (ref 135–145)
TOTAL PROTEIN: 6.9 g/dL (ref 6.0–8.3)

## 2015-07-30 LAB — HEMOGLOBIN A1C: Hgb A1c MFr Bld: 5.8 % (ref 4.6–6.5)

## 2015-08-06 ENCOUNTER — Ambulatory Visit (INDEPENDENT_AMBULATORY_CARE_PROVIDER_SITE_OTHER)
Admission: RE | Admit: 2015-08-06 | Discharge: 2015-08-06 | Disposition: A | Discharge status: Home / Self Care | Payer: Medicare Other | Source: Ambulatory Visit | Attending: Family Medicine | Admitting: Family Medicine

## 2015-08-06 ENCOUNTER — Ambulatory Visit (INDEPENDENT_AMBULATORY_CARE_PROVIDER_SITE_OTHER): Payer: Medicare Other | Admitting: Family Medicine

## 2015-08-06 ENCOUNTER — Encounter: Payer: Self-pay | Admitting: Radiology

## 2015-08-06 ENCOUNTER — Encounter: Payer: Self-pay | Admitting: Family Medicine

## 2015-08-06 VITALS — BP 150/75 | HR 91 | Temp 98.3°F | Ht 62.0 in | Wt 151.5 lb

## 2015-08-06 DIAGNOSIS — M79672 Pain in left foot: Secondary | ICD-10-CM

## 2015-08-06 DIAGNOSIS — Z720 Tobacco use: Secondary | ICD-10-CM

## 2015-08-06 DIAGNOSIS — R739 Hyperglycemia, unspecified: Secondary | ICD-10-CM

## 2015-08-06 DIAGNOSIS — I10 Essential (primary) hypertension: Secondary | ICD-10-CM

## 2015-08-06 DIAGNOSIS — Z789 Other specified health status: Secondary | ICD-10-CM

## 2015-08-06 DIAGNOSIS — G6289 Other specified polyneuropathies: Secondary | ICD-10-CM

## 2015-08-06 MED ORDER — LISINOPRIL 10 MG PO TABS: 10.0000 mg | ORAL_TABLET | Freq: Every day | ORAL | Status: AC

## 2015-08-06 MED ORDER — METFORMIN HCL 500 MG PO TABS: 250.0000 mg | ORAL_TABLET | Freq: Two times a day (BID) | ORAL | Status: AC

## 2015-08-06 NOTE — Progress Notes (Signed)
Subjective:    Patient ID: Sandy Harper, female    DOB: 06/11/1940, 75 y.o.   MRN: [id number]  HPI Here for f/u of chronic health problems   Has felt fair   Seeing neuro for neuropathy- it is about the same Continues current medicines   Had heel pain -now it has moved to top of her foot Wore a boot - for a while (Dr Copland)-got better Now she has pain on the top of her foot (left) Hurts to stand on it  Pain when shoe touches it  No swelling or bruising   Started going to a chiropractor -for all of her pain and also back pain (last epidural was in the summer) Stiff in the am  Working on foot as well  Unsure if helping yet  Feels like foot pain is coming from her back as well    Wt is down 2 lb with bmi of 27  Blood pressure is up today BP Readings from Last 3 Encounters:  08/06/15 156/70  04/28/15 140/64  04/14/15 147/86   does not think it runs in her family  Not on bp med No formal dx of HTN    Chemistry      Component Value Date/Time   NA 141 07/30/2015 1042   NA 137 04/05/2013 1108   K 4.1 07/30/2015 1042   K 4.1 04/05/2013 1108   CL 104 07/30/2015 1042   CL 106 04/05/2013 1108   CO2 31 07/30/2015 1042   CO2 27 04/05/2013 1108   BUN 15 07/30/2015 1042   BUN 15 04/05/2013 1108   CREATININE 0.75 07/30/2015 1042   CREATININE 0.73 04/05/2013 1108      Component Value Date/Time   CALCIUM 9.5 07/30/2015 1042   CALCIUM 8.9 04/05/2013 1108   ALKPHOS 73 07/30/2015 1042   ALKPHOS 102 04/05/2013 1108   AST 18 07/30/2015 1042   AST 22 04/05/2013 1108   ALT 14 07/30/2015 1042   ALT 18 04/05/2013 1108   BILITOT 0.4 07/30/2015 1042   BILITOT 0.8 04/05/2013 1108       Hyperglycemia Stable Lab Results  Component Value Date   HGBA1C 5.8 07/30/2015   This is the same as last time  On metformin Wt is stable    Smoking status  Still e cig - with nicotine  No sob Would like to wean off nicotine   Patient Active Problem List   Diagnosis Date Noted    . Left foot pain 08/06/2015  . Essential hypertension 08/06/2015  . Plantar fasciitis of left foot 03/12/2015  . Screening for lipoid disorders 08/16/2014  . Left leg pain 02/20/2014  . Lumbar pain with radiation down left leg 02/20/2014  . B12 deficiency 02/18/2014  . Diverticulosis 06/30/2013  . History of motion sickness 06/30/2013  . Diverticulitis 04/08/2013  . Peripheral neuropathy (HCC) 04/08/2013  . Encounter for Medicare annual wellness exam 09/24/2012  . Leg cramps 08/28/2011  . Other screening mammogram 09/19/2010  . HEMORRHOIDS, EXTERNAL 12/30/2009  . OVARIAN CYST 08/23/2009  . BACK PAIN, THORACIC REGION, RIGHT 03/03/2009  . Vitamin D deficiency 11/12/2007  . DEPRESSION 10/01/2007  . GERD 10/01/2007  . Hyperglycemia 10/01/2007  . CALCULUS, URINARY NOS 02/26/2007  . Nicotine vapor product user 01/14/2007  . FIBROCYSTIC BREAST DISEASE 01/14/2007  . VITILIGO 01/14/2007  . DEGENERATIVE JOINT DISEASE 01/14/2007  . Osteopenia 01/14/2007  . SLEEP DISORDER 01/14/2007  . INSOMNIA 01/14/2007  . MURMUR 01/14/2007   Past Medical History  Diagnosis  Date  . Osteopenia   . Sleep disorder   . Kidney stones     With lithotripsy and stent  . Hyperlipidemia   . Hyperglycemia   . Depression   . Vitiligo   . Tobacco abuse   . Urine WBC increased 2005    Urology work-normal  . Heart murmur     echocardiogram- 10 yrs. ago- wnl  . Heart palpitations 2001    due to stress - pt. feels that it has resolved & it was related to stress at that time  . Degenerative joint disease     lumbar region & L hand - cortisone injection in past    Past Surgical History  Procedure Laterality Date  . Carpal tunnel release      bilateral  . Hemorrhoid surgery    . Oophorectomy      Left cyst  . Breast biopsy      X 2 left, benign  . Trigger finger release  2003    Right hand  . Hand surgery  02/10  . Back surgery  02/11    1995- decompression, then followed by fusion in 2011  .  Tubal ligation     Social History  Substance Use Topics  . Smoking status: Current Every Day Smoker -- 0.50 packs/day for 50 years    Types: Cigarettes  . Smokeless tobacco: Never Used     Comment: only smokes E-cigarettes  . Alcohol Use: 0.0 oz/week    0 Standard drinks or equivalent per week     Comment: social/ wine- once q two weeks    Family History  Problem Relation Age of Onset  . Heart disease Father     MI  . Cancer Sister     uterine cancer  . Cancer Brother     colon  . Diabetes Brother   . Heart disease Brother     CAD  . Diabetes Brother   . Cancer Brother     prostate  . Diabetes Brother    Allergies  Allergen Reactions  . Alendronate Sodium Other (See Comments)    reflux  . Bupropion Hcl Palpitations     heart palpitations   Current Outpatient Prescriptions on File Prior to Visit  Medication Sig Dispense Refill  . Docusate Calcium (STOOL SOFTENER PO) Take 1-2 capsules by mouth 2 (two) times daily as needed (Constipation).    . gabapentin (NEURONTIN) 300 MG capsule TAKE TWO CAPSULES THREE TIMES DAILY 180 capsule 1  . meloxicam (MOBIC) 15 MG tablet Take 1 tablet by mouth daily.    . nortriptyline (PAMELOR) 10 MG capsule Take 2 tablets at bedtime. 60 capsule 5  . traMADol (ULTRAM) 50 MG tablet Take 1 tablet (50 mg total) by mouth every 6 (six) hours as needed. 50 tablet 2  . zolpidem (AMBIEN) 10 MG tablet TAKE ONE TABLET AT BEDTIME AS NEEDED FORSLEEP 30 tablet 5   No current facility-administered medications on file prior to visit.    Review of Systems Review of Systems  Constitutional: Negative for fever, appetite change,  and unexpected weight change.  Eyes: Negative for pain and visual disturbance.  Respiratory: Negative for cough and shortness of breath.   Cardiovascular: Negative for cp or palpitations    Gastrointestinal: Negative for nausea, diarrhea and constipation.  Genitourinary: Negative for urgency and frequency.  Skin: Negative for  pallor or rash   MSK pos for chronic back pain / pos for L foot pain  Neurological: Negative for weakness,  light-headedness, numbness and headaches.  Hematological: Negative for adenopathy. Does not bruise/bleed easily.  Psychiatric/Behavioral: Negative for dysphoric mood. The patient is not nervous/anxious.         Objective:   Physical Exam  Constitutional: She appears well-developed and well-nourished. No distress.  overwt and well app  HENT:  Head: Normocephalic and atraumatic.  Mouth/Throat: Oropharynx is clear and moist.  Eyes: Conjunctivae and EOM are normal. Pupils are equal, round, and reactive to light.  Neck: Normal range of motion. Neck supple. No JVD present. Carotid bruit is not present. No thyromegaly present.  Cardiovascular: Normal rate, regular rhythm and intact distal pulses.  Exam reveals no gallop.   Murmur heard. Pulmonary/Chest: Effort normal and breath sounds normal. No respiratory distress. She has no wheezes. She has no rales.  No crackles  Abdominal: Soft. Bowel sounds are normal. She exhibits no distension, no abdominal bruit and no mass. There is no tenderness.  Musculoskeletal: She exhibits tenderness. She exhibits no edema.  Proximal 2nd/ 3rd metatarsals are point tender Nl rom  No swelling or ecchymosis  No neuro change-dec sens to light touch both feet  Lymphadenopathy:    She has no cervical adenopathy.  Neurological: She is alert. She has normal reflexes. A sensory deficit is present. No cranial nerve deficit. She exhibits normal muscle tone. Coordination normal.  Skin: Skin is warm and dry. No rash noted. No pallor.  Psychiatric: She has a normal mood and affect.          Assessment & Plan:   Problem List Items Addressed This Visit      Cardiovascular and Mediastinum   Essential hypertension    bp is up for the 3rd visit  BP Readings from Last 1 Encounters:  08/06/15 150/75   Will begin tx with lisinopril 10- dis poss side eff like  dizziness or cough   Disc lifstyle change with low sodium diet and exercise  Info given on dash diet  Fu planned  Labs reviewed       Relevant Medications   lisinopril (PRINIVIL,ZESTRIL) 10 MG tablet     Nervous and Auditory   Peripheral neuropathy (HCC)    Seeing neurology No improvement          Other   Hyperglycemia    Well controlled Lab Results  Component Value Date   HGBA1C 5.8 07/30/2015   Urged her to continue metformin and work on low glycemic diet and wt control       Left foot pain - Primary    Pt thought this was from her back - but she has point tenderness/see exam xr today  Is at risk for a stress fracture       Relevant Orders   DG Foot Complete Left (Completed)   Nicotine vapor product user    Enc her to cut back on nicotine gradually

## 2015-08-06 NOTE — Patient Instructions (Signed)
Start lisinopril for new diagnosis of hypertension (high blood pressure) Try to avoid sodium in your diet  Xray of foot today to make sure there is no evidence of a broken bone (stress fracture)  Keep cutting back on nicotine if you can  Urine screen today for ambien  A1C looks good Let's see if we can set you up for your annual exam in about 3 months or so

## 2015-08-06 NOTE — Progress Notes (Signed)
Pre visit review using our clinic review tool, if applicable. No additional management support is needed unless otherwise documented below in the visit note. 

## 2015-08-06 NOTE — Assessment & Plan Note (Addendum)
Seeing neurology No improvement

## 2015-08-08 NOTE — Assessment & Plan Note (Signed)
Pt thought this was from her back - but she has point tenderness/see exam xr today  Is at risk for a stress fracture

## 2015-08-08 NOTE — Assessment & Plan Note (Signed)
Enc her to cut back on nicotine gradually

## 2015-08-08 NOTE — Assessment & Plan Note (Signed)
Well controlled Lab Results  Component Value Date   HGBA1C 5.8 07/30/2015   Urged her to continue metformin and work on low glycemic diet and wt control

## 2015-08-08 NOTE — Assessment & Plan Note (Signed)
bp is up for the 3rd visit  BP Readings from Last 1 Encounters:  08/06/15 150/75   Will begin tx with lisinopril 10- dis poss side eff like dizziness or cough   Disc lifstyle change with low sodium diet and exercise  Info given on dash diet  Fu planned  Labs reviewed

## 2015-08-10 ENCOUNTER — Telehealth: Payer: Self-pay | Admitting: Family Medicine

## 2015-08-10 NOTE — Telephone Encounter (Signed)
Pt advise results released on mychart and I read her the results

## 2015-08-10 NOTE — Telephone Encounter (Signed)
Pt request cb regarding foot xray  cb number is [phone] Thanks

## 2015-08-31 ENCOUNTER — Other Ambulatory Visit: Payer: Self-pay | Admitting: Family Medicine

## 2015-08-31 NOTE — Telephone Encounter (Signed)
Please call in the Gordonambien and I will refill the other electronically

## 2015-08-31 NOTE — Telephone Encounter (Signed)
Pt has a lab appt, AWV with lisa, and an f/u with you scheduled on 09/15/15, please advise

## 2015-09-01 NOTE — Telephone Encounter (Signed)
Rx called in as prescribed 

## 2015-09-08 ENCOUNTER — Other Ambulatory Visit: Payer: Self-pay | Admitting: *Deleted

## 2015-09-08 NOTE — Telephone Encounter (Signed)
I can't tell if you've prescribed this med before please advise

## 2015-09-08 NOTE — Telephone Encounter (Signed)
Please refill times 5 

## 2015-09-09 MED ORDER — MELOXICAM 15 MG PO TABS: 15.0000 mg | ORAL_TABLET | Freq: Every day | ORAL | Status: DC

## 2015-09-12 ENCOUNTER — Telehealth: Payer: Self-pay | Admitting: Family Medicine

## 2015-09-12 DIAGNOSIS — E559 Vitamin D deficiency, unspecified: Secondary | ICD-10-CM

## 2015-09-12 DIAGNOSIS — R739 Hyperglycemia, unspecified: Secondary | ICD-10-CM

## 2015-09-12 DIAGNOSIS — I10 Essential (primary) hypertension: Secondary | ICD-10-CM

## 2015-09-12 DIAGNOSIS — E538 Deficiency of other specified B group vitamins: Secondary | ICD-10-CM

## 2015-09-12 NOTE — Telephone Encounter (Signed)
-----   Message from Baldomero LamyNatasha C Chavers sent at 09/09/2015 10:14 AM EDT ----- Regarding: Cpx labs Wed 4/19, need orders. Thanks! :-) Please order  future cpx labs for pt's upcoming lab appt. Thanks Rodney Boozeasha

## 2015-09-15 ENCOUNTER — Ambulatory Visit (INDEPENDENT_AMBULATORY_CARE_PROVIDER_SITE_OTHER): Payer: Medicare Other | Admitting: Family Medicine

## 2015-09-15 ENCOUNTER — Other Ambulatory Visit (INDEPENDENT_AMBULATORY_CARE_PROVIDER_SITE_OTHER): Payer: Medicare Other

## 2015-09-15 ENCOUNTER — Encounter: Payer: Self-pay | Admitting: Family Medicine

## 2015-09-15 ENCOUNTER — Ambulatory Visit (INDEPENDENT_AMBULATORY_CARE_PROVIDER_SITE_OTHER): Payer: Medicare Other

## 2015-09-15 VITALS — BP 118/78 | HR 86 | Temp 98.6°F | Ht 60.0 in | Wt 153.0 lb

## 2015-09-15 DIAGNOSIS — E538 Deficiency of other specified B group vitamins: Secondary | ICD-10-CM

## 2015-09-15 DIAGNOSIS — N952 Postmenopausal atrophic vaginitis: Secondary | ICD-10-CM

## 2015-09-15 DIAGNOSIS — Z23 Encounter for immunization: Secondary | ICD-10-CM | POA: Diagnosis not present

## 2015-09-15 DIAGNOSIS — I10 Essential (primary) hypertension: Secondary | ICD-10-CM

## 2015-09-15 DIAGNOSIS — M8580 Other specified disorders of bone density and structure, unspecified site: Secondary | ICD-10-CM | POA: Diagnosis not present

## 2015-09-15 DIAGNOSIS — E559 Vitamin D deficiency, unspecified: Secondary | ICD-10-CM | POA: Diagnosis not present

## 2015-09-15 DIAGNOSIS — Z0000 Encounter for general adult medical examination without abnormal findings: Secondary | ICD-10-CM

## 2015-09-15 DIAGNOSIS — R739 Hyperglycemia, unspecified: Secondary | ICD-10-CM

## 2015-09-15 LAB — CBC WITH DIFFERENTIAL/PLATELET
BASOS ABS: 0 10*3/uL (ref 0.0–0.1)
Basophils Relative: 0.6 % (ref 0.0–3.0)
EOS ABS: 0.1 10*3/uL (ref 0.0–0.7)
Eosinophils Relative: 2.1 % (ref 0.0–5.0)
HCT: 43.9 % (ref 36.0–46.0)
Hemoglobin: 14.8 g/dL (ref 12.0–15.0)
LYMPHS ABS: 1.9 10*3/uL (ref 0.7–4.0)
Lymphocytes Relative: 26.8 % (ref 12.0–46.0)
MCHC: 33.8 g/dL (ref 30.0–36.0)
MCV: 95.5 fl (ref 78.0–100.0)
MONO ABS: 0.6 10*3/uL (ref 0.1–1.0)
Monocytes Relative: 8.7 % (ref 3.0–12.0)
NEUTROS PCT: 61.8 % (ref 43.0–77.0)
Neutro Abs: 4.5 10*3/uL (ref 1.4–7.7)
Platelets: 276 10*3/uL (ref 150.0–400.0)
RBC: 4.59 Mil/uL (ref 3.87–5.11)
RDW: 14.4 % (ref 11.5–15.5)
WBC: 7.2 10*3/uL (ref 4.0–10.5)

## 2015-09-15 LAB — VITAMIN D 25 HYDROXY (VIT D DEFICIENCY, FRACTURES): VITD: 24.42 ng/mL — ABNORMAL LOW (ref 30.00–100.00)

## 2015-09-15 LAB — COMPREHENSIVE METABOLIC PANEL
ALK PHOS: 80 U/L (ref 39–117)
ALT: 14 U/L (ref 0–35)
AST: 20 U/L (ref 0–37)
Albumin: 4.5 g/dL (ref 3.5–5.2)
BUN: 14 mg/dL (ref 6–23)
CO2: 33 mEq/L — ABNORMAL HIGH (ref 19–32)
Calcium: 10 mg/dL (ref 8.4–10.5)
Chloride: 102 mEq/L (ref 96–112)
Creatinine, Ser: 0.75 mg/dL (ref 0.40–1.20)
GFR: 80.24 mL/min (ref >60.00)
GLUCOSE: 99 mg/dL (ref 70–99)
POTASSIUM: 4.4 meq/L (ref 3.5–5.1)
SODIUM: 142 meq/L (ref 135–145)
Total Bilirubin: 0.4 mg/dL (ref 0.2–1.2)
Total Protein: 7.3 g/dL (ref 6.0–8.3)

## 2015-09-15 LAB — LIPID PANEL
CHOL/HDL RATIO: 3
Cholesterol: 194 mg/dL (ref 0–200)
HDL: 62.8 mg/dL (ref >39.00)
LDL CALC: 95 mg/dL (ref 0–99)
NONHDL: 130.9
Triglycerides: 180 mg/dL — ABNORMAL HIGH (ref 0.0–149.0)
VLDL: 36 mg/dL (ref 0.0–40.0)

## 2015-09-15 LAB — HEMOGLOBIN A1C: Hgb A1c MFr Bld: 6.1 % (ref 4.6–6.5)

## 2015-09-15 LAB — TSH: TSH: 2.46 u[IU]/mL (ref 0.35–4.50)

## 2015-09-15 LAB — VITAMIN B12: Vitamin B-12: 374 pg/mL (ref 211–911)

## 2015-09-15 MED ORDER — ZOLPIDEM TARTRATE 10 MG PO TABS: ORAL_TABLET | ORAL | Status: DC

## 2015-09-15 MED ORDER — ESTROGENS, CONJUGATED 0.625 MG/GM VA CREA: TOPICAL_CREAM | VAGINAL | Status: AC

## 2015-09-15 NOTE — Progress Notes (Signed)
Subjective:   Sandy Harper is a 75 y.o. female who presents for Medicare Annual (Subsequent) preventive examination.   Cardiac Risk Factors include: advanced age (>33men, >68 women);hypertension     Objective:     Vitals: BP 118/78 mmHg  Pulse 86  Temp(Src) 98.6 F (37 C) (Oral)  Ht 5' (1.524 m)  Wt 153 lb (69.4 kg)  BMI 29.88 kg/m2  SpO2 97%  LMP 05/30/1995  Body mass index is 29.88 kg/(m^2).   Tobacco History  Smoking status  . Current Every Day Smoker -- 0.50 packs/day for 50 years  . Types: Cigarettes  Smokeless tobacco  . Never Used    Comment: only smokes E-cigarettes     Ready to quit: Yes Counseling given: No   Past Medical History  Diagnosis Date  . Osteopenia   . Sleep disorder   . Kidney stones     With lithotripsy and stent  . Hyperlipidemia   . Hyperglycemia   . Depression   . Vitiligo   . Tobacco abuse   . Urine WBC increased 2005    Urology work-normal  . Heart murmur     echocardiogram- 10 yrs. ago- wnl  . Heart palpitations 2001    due to stress - pt. feels that it has resolved & it was related to stress at that time  . Degenerative joint disease     lumbar region & L hand - cortisone injection in past    Past Surgical History  Procedure Laterality Date  . Carpal tunnel release      bilateral  . Hemorrhoid surgery    . Oophorectomy      Left cyst  . Breast biopsy      X 2 left, benign  . Trigger finger release  2003    Right hand  . Hand surgery  02/10  . Back surgery  02/11    1995- decompression, then followed by fusion in 2011  . Tubal ligation     Family History  Problem Relation Age of Onset  . Heart disease Father     MI  . Cancer Sister     uterine cancer  . Cancer Brother     colon  . Diabetes Brother   . Heart disease Brother     CAD  . Diabetes Brother   . Cancer Brother     prostate  . Diabetes Brother    History  Sexual Activity  . Sexual Activity: No    Outpatient Encounter Prescriptions as  of 09/15/2015  Medication Sig  . cholecalciferol (VITAMIN D) 1000 units tablet Take 2,000 Units by mouth daily.  Tery Sanfilippo Calcium (STOOL SOFTENER PO) Take 1-2 capsules by mouth 2 (two) times daily as needed (Constipation).  . gabapentin (NEURONTIN) 300 MG capsule TAKE TWO CAPSULES THREE TIMES DAILY  . lisinopril (PRINIVIL,ZESTRIL) 10 MG tablet Take 1 tablet (10 mg total) by mouth daily.  . meloxicam (MOBIC) 15 MG tablet Take 1 tablet (15 mg total) by mouth daily.  . metFORMIN (GLUCOPHAGE) 500 MG tablet Take 0.5 tablets (250 mg total) by mouth 2 (two) times daily with a meal.  . nortriptyline (PAMELOR) 10 MG capsule Take 2 tablets at bedtime.  . psyllium (METAMUCIL) 58.6 % powder Take 1 packet by mouth daily.  . vitamin B-12 (CYANOCOBALAMIN) 250 MCG tablet Take 250 mcg by mouth daily.  Marland Kitchen zolpidem (AMBIEN) 10 MG tablet TAKE ONE (1) TABLET AT BEDTIME AS NEEDEDFOR SLEEP  . [DISCONTINUED] traMADol (ULTRAM) 50 MG  tablet Take 1 tablet (50 mg total) by mouth every 6 (six) hours as needed.   No facility-administered encounter medications on file as of 09/15/2015.    Activities of Daily Living In your present state of health, do you have any difficulty performing the following activities: 09/15/2015  Hearing? Y  Vision? N  Difficulty concentrating or making decisions? Y  Walking or climbing stairs? Y  Dressing or bathing? N  Doing errands, shopping? N  Preparing Food and eating ? N  Using the Toilet? N  In the past six months, have you accidently leaked urine? Y  Do you have problems with loss of bowel control? N  Managing your Medications? N  Managing your Finances? N  Housekeeping or managing your Housekeeping? N    Patient Care Team: Judy PimpleMarne A Tower, MD as PCP - Leda [facility] as Consulting Physician Barnett AbuHenry Elsner, MD as Consulting Physician (Neurosurgery) [place] Chardonika K Patel, DO as Consulting Physician (Neurology)    Assessment:    Hearing Screening Comments: Last hearing  test in 2016; per audiologist, pt would be a good candidate for bilateral hearing aids Vision Screening Comments: [facility] exam in 2016 [facility]  Exercise Activities and Dietary recommendations Current Exercise Habits: Home exercise routine, Type of exercise: walking, Time (Minutes): 30, Frequency (Times/Week): 3, Weekly Exercise (Minutes/Week): 90, Intensity: Mild, Exercise limited by: None identified  Goals    . Reduce sugar intake to X grams per day     Starting 09/15/2015, I will continue to incorporate low glycemic foods within my diet to keep blood glucose within normal limits.       Fall Risk Fall Risk  09/15/2015 09/08/2014 10/04/2012  Falls in the past year? No No No   Depression Screen PHQ 2/9 Scores 09/15/2015 09/08/2014 10/04/2012  PHQ - 2 Score 0 0 0     Cognitive Testing MMSE - Mini Mental State Exam 09/15/2015  Orientation to time 5  Orientation to Place 5  Registration 3  Attention/ Calculation 0  Recall 3  Language- name 2 objects 0  Language- repeat 1  Language- follow 3 step command 3  Language- read & follow direction 0  Write a sentence 0  Copy design 0  Total score 20   PLEASE NOTE: A Mini-Cog screen was completed. Maximum score is 20. A value of 0 denotes this part of Folstein MMSE was not completed.  Orientation to Time - Max 5 Orientation to Place - Max 5 Registration - Max 3 Recall - Max 3 Language Repeat - Max 1 Language Follow 3 Step Command - Max 3   Immunization History  Administered Date(s) Administered  . Influenza Split 02/27/2012  . Influenza Whole 02/27/2007, 02/27/2008, 03/24/2009, 04/02/2010  . Influenza, High Dose Seasonal PF 02/24/2015  . Influenza-Unspecified 02/26/2013, 01/27/2014  . Pneumococcal Conjugate-13 09/08/2014  . Pneumococcal Polysaccharide-23 06/25/2006  . Td 05/31/1999, 11/12/2007  . Zoster 08/28/2011   Screening Tests Health Maintenance  Topic Date Due  . MAMMOGRAM  09/14/2016 (Originally 12/23/2013)   . INFLUENZA VACCINE  12/28/2015  . TETANUS/TDAP  11/11/2017  . COLONOSCOPY  08/01/2023  . DEXA SCAN  Completed  . ZOSTAVAX  Completed  . PNA vac Low Risk Adult  Completed      Plan:       I have personally reviewed and addressed the Medicare Annual Wellness questionnaire and have noted the following in the patient's chart:  A. Medical and social history B. Use of alcohol, tobacco or illicit drugs  C. Current medications and supplements D. Functional ability and status E.  Nutritional status F.  Physical activity G. Advance directives H. List of other physicians I.  Hospitalizations, surgeries, and ER visits in previous 12 months J.  Vitals K. Screenings to include hearing, vision, cognitive, depression L. Referrals and appointments - none  In addition, I have reviewed and discussed with patient certain preventive protocols, quality metrics, and best practice recommendations. A written personalized care plan for preventive services as well as general preventive health recommendations were provided to patient.  See attached scanned questionnaire for additional information.   Signed,   Randa Evens, MHA, BS, LPN Health Advisor 09/15/2015

## 2015-09-15 NOTE — Patient Instructions (Signed)
Ms. Sandy Harper , Thank you for taking time to come for your Medicare Wellness Visit. I appreciate your ongoing commitment to your health goals. Please review the following plan we discussed and let me know if I can assist you in the future.   These are the goals we discussed: Goals    . Reduce sugar intake to X grams per day     Starting 09/15/2015, I will continue to incorporate low glycemic foods within my diet to keep blood glucose within normal limits.        This is a list of the screening recommended for you and due dates:  Health Maintenance  Topic Date Due  . Mammogram  09/14/2016*  . Flu Shot  12/28/2015  . Tetanus Vaccine  11/11/2017  . Colon Cancer Screening  08/01/2023  . DEXA scan (bone density measurement)  Completed  . Shingles Vaccine  Completed  . Pneumonia vaccines  Completed  *Topic was postponed. The date shown is not the original due date.   Preventive Care for Adults  A healthy lifestyle and preventive care can promote health and wellness. Preventive health guidelines for adults include the following key practices.  . A routine yearly physical is a good way to check with your health care provider about your health and preventive screening. It is a chance to share any concerns and updates on your health and to receive a thorough exam.  . Visit your dentist for a routine exam and preventive care every 6 months. Brush your teeth twice a day and floss once a day. Good oral hygiene prevents tooth decay and gum disease.  . The frequency of eye exams is based on your age, health, family medical history, use  of contact lenses, and other factors. Follow your health care provider's ecommendations for frequency of eye exams.  . Eat a healthy diet. Foods like vegetables, fruits, whole grains, low-fat dairy products, and lean protein foods contain the nutrients you need without too many calories. Decrease your intake of foods high in solid fats, added sugars, and salt. Eat the  right amount of calories for you. Get information about a proper diet from your health care provider, if necessary.  . Regular physical exercise is one of the most important things you can do for your health. Most adults should get at least 150 minutes of moderate-intensity exercise (any activity that increases your heart rate and causes you to sweat) each week. In addition, most adults need muscle-strengthening exercises on 2 or more days a week.  Silver Sneakers may be a benefit available to you. To determine eligibility, you may visit the website: www.silversneakers.com or contact program at [phone] Mon-Fri between 8AM-8PM.   . Maintain a healthy weight. The body mass index (BMI) is a screening tool to identify possible weight problems. It provides an estimate of body fat based on height and weight. Your health care provider can find your BMI and can help you achieve or maintain a healthy weight.   For adults 20 years and older: ? A BMI below 18.5 is considered underweight. ? A BMI of 18.5 to 24.9 is normal. ? A BMI of 25 to 29.9 is considered overweight. ? A BMI of 30 and above is considered obese.   . Maintain normal blood lipids and cholesterol levels by exercising and minimizing your intake of saturated fat. Eat a balanced diet with plenty of fruit and vegetables. Blood tests for lipids and cholesterol should begin at age 74 and be repeated every  5 years. If your lipid or cholesterol levels are high, you are over 50, or you are at high risk for heart disease, you may need your cholesterol levels checked more frequently. Ongoing high lipid and cholesterol levels should be treated with medicines if diet and exercise are not working.  . If you smoke, find out from your health care provider how to quit. If you do not use tobacco, please do not start.  . If you choose to drink alcohol, please do not consume more than 2 drinks per day. One drink is considered to be 12 ounces (355 mL) of  beer, 5 ounces (148 mL) of wine, or 1.5 ounces (44 mL) of liquor.  . If you are 37-41 years old, ask your health care provider if you should take aspirin to prevent strokes.  . Use sunscreen. Apply sunscreen liberally and repeatedly throughout the day. You should seek shade when your shadow is shorter than you. Protect yourself by wearing long sleeves, pants, a wide-brimmed hat, and sunglasses year round, whenever you are outdoors.  . Once a month, do a whole body skin exam, using a mirror to look at the skin on your back. Tell your health care provider of new moles, moles that have irregular borders, moles that are larger than a pencil eraser, or moles that have changed in shape or color.

## 2015-09-15 NOTE — Progress Notes (Signed)
Pre visit review using our clinic review tool, if applicable. No additional management support is needed unless otherwise documented below in the visit note. 

## 2015-09-15 NOTE — Patient Instructions (Signed)
Labs pending  Do get back on calcium and at least 3000 iu of vitamin D daily  Stay as active as you can be Schedule your own mammogram at [facility]  Take care of yourself Try the estrogen cream - see if this helps your vulvar symptoms   Follow up in about 6 months

## 2015-09-15 NOTE — Progress Notes (Signed)
Subjective:    Patient ID: Sandy Harper, female    DOB: 06-20-1940, 75 y.o.   MRN: [id number]  HPI Here for f/u of chronic health problems   Wt is stable with bmi of 29  Saw Lesia for her AMW visit and that went well  Noted hard of hearing Does not want hearing aides quite yet - may consider later   Due for a mammogram - still has not had it yet - goes to [facility] - will make her own apptointment  Self breast exam-no lumps or changes   Colonoscopy was 3/15 with 5 y recall due to polyp and fam hx   dexa 4/05 No falls or fractures  Osteopenia  Intol of fosamax Hx of low D--not taking ca and D   Has vaginal atrophy- externally more soreness (not internal)    Had a pneumonia vaccine today to get her up to date   Uses e cigarettes No regular cigarettes   bp is stable today  No cp or palpitations or headaches or edema  No side effects to medicines  BP Readings from Last 3 Encounters:  09/15/15 118/78  09/15/15 118/78  08/06/15 150/75    Better after addn of lisinopril  Well controlled at home also - systolic 120  Had a little cough-that is better now     Chemistry      Component Value Date/Time   NA 141 07/30/2015 1042   NA 137 04/05/2013 1108   K 4.1 07/30/2015 1042   K 4.1 04/05/2013 1108   CL 104 07/30/2015 1042   CL 106 04/05/2013 1108   CO2 31 07/30/2015 1042   CO2 27 04/05/2013 1108   BUN 15 07/30/2015 1042   BUN 15 04/05/2013 1108   CREATININE 0.75 07/30/2015 1042   CREATININE 0.73 04/05/2013 1108      Component Value Date/Time   CALCIUM 9.5 07/30/2015 1042   CALCIUM 8.9 04/05/2013 1108   ALKPHOS 73 07/30/2015 1042   ALKPHOS 102 04/05/2013 1108   AST 18 07/30/2015 1042   AST 22 04/05/2013 1108   ALT 14 07/30/2015 1042   ALT 18 04/05/2013 1108   BILITOT 0.4 07/30/2015 1042   BILITOT 0.8 04/05/2013 1108      Hx of B12 def  Hyperglycemia stable  Lab Results  Component Value Date   HGBA1C 5.8 07/30/2015   Labs today are pending     Patient Active Problem List   Diagnosis Date Noted  . Atrophic vaginitis 09/15/2015  . Left foot pain 08/06/2015  . Essential hypertension 08/06/2015  . Plantar fasciitis of left foot 03/12/2015  . Screening for lipoid disorders 08/16/2014  . Left leg pain 02/20/2014  . Lumbar pain with radiation down left leg 02/20/2014  . B12 deficiency 02/18/2014  . Diverticulosis 06/30/2013  . History of motion sickness 06/30/2013  . Diverticulitis 04/08/2013  . Peripheral neuropathy (HCC) 04/08/2013  . Encounter for Medicare annual wellness exam 09/24/2012  . Leg cramps 08/28/2011  . Other screening mammogram 09/19/2010  . HEMORRHOIDS, EXTERNAL 12/30/2009  . OVARIAN CYST 08/23/2009  . BACK PAIN, THORACIC REGION, RIGHT 03/03/2009  . Vitamin D deficiency 11/12/2007  . DEPRESSION 10/01/2007  . GERD 10/01/2007  . Hyperglycemia 10/01/2007  . CALCULUS, URINARY NOS 02/26/2007  . Nicotine vapor product user 01/14/2007  . FIBROCYSTIC BREAST DISEASE 01/14/2007  . VITILIGO 01/14/2007  . DEGENERATIVE JOINT DISEASE 01/14/2007  . Osteopenia 01/14/2007  . SLEEP DISORDER 01/14/2007  . INSOMNIA 01/14/2007  . MURMUR 01/14/2007  Past Medical History  Diagnosis Date  . Osteopenia   . Sleep disorder   . Kidney stones     With lithotripsy and stent  . Hyperlipidemia   . Hyperglycemia   . Depression   . Vitiligo   . Tobacco abuse   . Urine WBC increased 2005    Urology work-normal  . Heart murmur     echocardiogram- 10 yrs. ago- wnl  . Heart palpitations 2001    due to stress - pt. feels that it has resolved & it was related to stress at that time  . Degenerative joint disease     lumbar region & L hand - cortisone injection in past    Past Surgical History  Procedure Laterality Date  . Carpal tunnel release      bilateral  . Hemorrhoid surgery    . Oophorectomy      Left cyst  . Breast biopsy      X 2 left, benign  . Trigger finger release  2003    Right hand  . Hand surgery   02/10  . Back surgery  02/11    1995- decompression, then followed by fusion in 2011  . Tubal ligation     Social History  Substance Use Topics  . Smoking status: Current Every Day Smoker -- 0.50 packs/day for 50 years    Types: Cigarettes  . Smokeless tobacco: Never Used     Comment: only smokes E-cigarettes  . Alcohol Use: 0.0 oz/week    0 Standard drinks or equivalent per week     Comment: social/ wine- once q two weeks    Family History  Problem Relation Age of Onset  . Heart disease Father     MI  . Cancer Sister     uterine cancer  . Cancer Brother     colon  . Diabetes Brother   . Heart disease Brother     CAD  . Diabetes Brother   . Cancer Brother     prostate  . Diabetes Brother    Allergies  Allergen Reactions  . Alendronate Sodium Other (See Comments)    reflux  . Bupropion Hcl Palpitations     heart palpitations   Current Outpatient Prescriptions on File Prior to Visit  Medication Sig Dispense Refill  . cholecalciferol (VITAMIN D) 1000 units tablet Take 2,000 Units by mouth daily.    Tery Sanfilippo Calcium (STOOL SOFTENER PO) Take 1-2 capsules by mouth 2 (two) times daily as needed (Constipation).    . gabapentin (NEURONTIN) 300 MG capsule TAKE TWO CAPSULES THREE TIMES DAILY 180 capsule 0  . lisinopril (PRINIVIL,ZESTRIL) 10 MG tablet Take 1 tablet (10 mg total) by mouth daily. 30 tablet 11  . meloxicam (MOBIC) 15 MG tablet Take 1 tablet (15 mg total) by mouth daily. 30 tablet 4  . metFORMIN (GLUCOPHAGE) 500 MG tablet Take 0.5 tablets (250 mg total) by mouth 2 (two) times daily with a meal. 30 tablet 11  . nortriptyline (PAMELOR) 10 MG capsule Take 2 tablets at bedtime. 60 capsule 5  . psyllium (METAMUCIL) 58.6 % powder Take 1 packet by mouth daily.    . vitamin B-12 (CYANOCOBALAMIN) 250 MCG tablet Take 250 mcg by mouth daily.     No current facility-administered medications on file prior to visit.    Review of Systems Review of Systems  Constitutional:  Negative for fever, appetite change,  and unexpected weight change. pos for fatigue Eyes: Negative for pain and visual disturbance.  Respiratory: Negative for cough and shortness of breath.   Cardiovascular: Negative for cp or palpitations    Gastrointestinal: Negative for nausea, diarrhea and constipation.  Genitourinary: Negative for urgency and frequency.  Skin: Negative for pallor or rash   Neurological: Negative for weakness, light-headedness, and headaches. pos for progressive neuropathy symptoms  MSK pos for aches and pains Hematological: Negative for adenopathy. Does not bruise/bleed easily.  Psychiatric/Behavioral: Negative for dysphoric mood. The patient is not nervous/anxious.         Objective:   Physical Exam  Constitutional: She appears well-developed and well-nourished. No distress.  overwt and well appearing   HENT:  Head: Normocephalic and atraumatic.  Right Ear: External ear normal.  Left Ear: External ear normal.  Mouth/Throat: Oropharynx is clear and moist.  Eyes: Conjunctivae and EOM are normal. Pupils are equal, round, and reactive to light. No scleral icterus.  Neck: Normal range of motion. Neck supple. No JVD present. Carotid bruit is not present. No thyromegaly present.  Cardiovascular: Normal rate, regular rhythm and intact distal pulses.  Exam reveals no gallop.   Murmur heard. Pulmonary/Chest: Effort normal and breath sounds normal. No respiratory distress. She has no wheezes. She exhibits no tenderness.  Abdominal: Soft. Bowel sounds are normal. She exhibits no distension, no abdominal bruit and no mass. There is no tenderness.  Genitourinary: Vagina normal. No breast swelling, tenderness, discharge or bleeding.  Breast exam: No mass, nodules, thickening, tenderness, bulging, retraction, inflamation, nipple discharge or skin changes noted.  No axillary or clavicular LA.      Nl appearing vulva w/o mucosal interruption or plaques  Musculoskeletal: She  exhibits no edema or tenderness.  Limited rom of LS   No kyphosis  Lymphadenopathy:    She has no cervical adenopathy.  Neurological: She is alert. She has normal reflexes. No cranial nerve deficit. She exhibits normal muscle tone. Coordination normal.  Skin: Skin is warm and dry. No rash noted. No erythema. No pallor.  Some lentigines and sks   Psychiatric: She has a normal mood and affect.          Assessment & Plan:   Problem List Items Addressed This Visit      Cardiovascular and Mediastinum   Essential hypertension - Primary    bp in fair control at this time  BP Readings from Last 1 Encounters:  09/15/15 118/78   No changes needed Disc lifstyle change with low sodium diet and exercise  Labs today Wt loss enc        Digestive   B12 deficiency    Check level today  Continued neuropathy symptoms         Musculoskeletal and Integument   Osteopenia    Pt declines further dexa at this time  No fractures  Stressed imp of ca and D  Disc need for calcium/ vitamin D/ wt bearing exercise and bone density test every 2 y to monitor Disc safety/ fracture risk in detail          Genitourinary   Atrophic vaginitis    Most of her symptoms are on vulva externally  Has seen gyn for this  Px premarin cream to try twice weekly and update        Other   Hyperglycemia    This has been controlled with diet  Enc wt loss and low glycemic diet to prevent DM      Vitamin D deficiency    Not taking D Disc imp to bone and overall  health  Level today  Adv to get back on this

## 2015-09-16 NOTE — Assessment & Plan Note (Signed)
Not taking D Disc imp to bone and overall health  Level today  Adv to get back on this

## 2015-09-16 NOTE — Assessment & Plan Note (Signed)
Pt declines further dexa at this time  No fractures  Stressed imp of ca and D  Disc need for calcium/ vitamin D/ wt bearing exercise and bone density test every 2 y to monitor Disc safety/ fracture risk in detail

## 2015-09-16 NOTE — Assessment & Plan Note (Signed)
bp in fair control at this time  BP Readings from Last 1 Encounters:  09/15/15 118/78   No changes needed Disc lifstyle change with low sodium diet and exercise  Labs today Wt loss enc

## 2015-09-16 NOTE — Assessment & Plan Note (Signed)
Check level today  Continued neuropathy symptoms

## 2015-09-16 NOTE — Assessment & Plan Note (Signed)
This has been controlled with diet  Enc wt loss and low glycemic diet to prevent DM

## 2015-09-16 NOTE — Assessment & Plan Note (Signed)
Most of her symptoms are on vulva externally  Has seen gyn for this  Px premarin cream to try twice weekly and update

## 2015-09-16 NOTE — Progress Notes (Signed)
   Subjective:    Patient ID: Sandy Harper, female    DOB: 08/25/1940, 75 y.o.   MRN: [id number]  HPI    Review of Systems     Objective:   Physical Exam        Assessment & Plan:  I reviewed health advisor's note, was available for consultation, and agree with documentation and plan.

## 2015-10-01 ENCOUNTER — Other Ambulatory Visit: Payer: Self-pay | Admitting: *Deleted

## 2015-10-01 MED ORDER — NORTRIPTYLINE HCL 10 MG PO CAPS: ORAL_CAPSULE | ORAL | Status: AC

## 2015-10-26 ENCOUNTER — Other Ambulatory Visit: Payer: Self-pay | Admitting: Family Medicine

## 2015-10-27 NOTE — Telephone Encounter (Signed)
Please refill for a year  Thanks  

## 2015-10-27 NOTE — Telephone Encounter (Signed)
Pt had her CPE and AWV on 09/15/15, last filled on 08/31/15 #180 with 0 refills, please advise

## 2015-10-27 NOTE — Telephone Encounter (Signed)
done

## 2016-01-05 ENCOUNTER — Other Ambulatory Visit: Payer: Self-pay | Admitting: *Deleted

## 2016-01-19 ENCOUNTER — Other Ambulatory Visit: Payer: Self-pay | Admitting: *Deleted

## 2016-01-19 MED ORDER — ZOLPIDEM TARTRATE 10 MG PO TABS: ORAL_TABLET | ORAL | 3 refills | Status: AC

## 2016-01-19 NOTE — Telephone Encounter (Signed)
Rx called in as prescribed 

## 2016-01-19 NOTE — Telephone Encounter (Signed)
Pt had her AWV with lisa and f/u with Dr. Milinda Antisower on 09/15/15, last filled on 09/15/15 #30 with 3 additional refills, please advise

## 2016-01-19 NOTE — Telephone Encounter (Signed)
Px written for call in   

## 2016-02-10 ENCOUNTER — Other Ambulatory Visit (HOSPITAL_COMMUNITY): Payer: Self-pay | Admitting: Neurological Surgery

## 2016-02-10 ENCOUNTER — Other Ambulatory Visit: Payer: Self-pay | Admitting: Family Medicine

## 2016-02-10 DIAGNOSIS — M5416 Radiculopathy, lumbar region: Principal | ICD-10-CM

## 2016-02-10 DIAGNOSIS — G8929 Other chronic pain: Secondary | ICD-10-CM

## 2016-02-10 NOTE — Telephone Encounter (Signed)
Please refill for a year  

## 2016-02-10 NOTE — Telephone Encounter (Signed)
Last OV was 09/15/15, last filled on 09/09/15 #30 tabs with 4 additional refill, please advise

## 2016-02-11 NOTE — Telephone Encounter (Signed)
done

## 2016-02-16 ENCOUNTER — Ambulatory Visit (HOSPITAL_COMMUNITY)
Admission: RE | Admit: 2016-02-16 | Discharge: 2016-02-16 | Disposition: A | Discharge status: Home / Self Care | Payer: Medicare Other | Source: Ambulatory Visit | Attending: Neurological Surgery | Admitting: Neurological Surgery

## 2016-02-16 DIAGNOSIS — Z9889 Other specified postprocedural states: Secondary | ICD-10-CM | POA: Diagnosis not present

## 2016-02-16 DIAGNOSIS — M4316 Spondylolisthesis, lumbar region: Secondary | ICD-10-CM | POA: Insufficient documentation

## 2016-02-16 DIAGNOSIS — M4806 Spinal stenosis, lumbar region: Secondary | ICD-10-CM | POA: Insufficient documentation

## 2016-02-16 DIAGNOSIS — M5416 Radiculopathy, lumbar region: Secondary | ICD-10-CM | POA: Insufficient documentation

## 2016-02-16 DIAGNOSIS — M5136 Other intervertebral disc degeneration, lumbar region: Secondary | ICD-10-CM | POA: Diagnosis not present

## 2016-02-16 DIAGNOSIS — G039 Meningitis, unspecified: Secondary | ICD-10-CM | POA: Diagnosis not present

## 2016-02-16 DIAGNOSIS — Z981 Arthrodesis status: Secondary | ICD-10-CM | POA: Insufficient documentation

## 2016-02-16 DIAGNOSIS — M4805 Spinal stenosis, thoracolumbar region: Secondary | ICD-10-CM | POA: Insufficient documentation

## 2016-02-16 DIAGNOSIS — G8929 Other chronic pain: Secondary | ICD-10-CM

## 2016-02-16 LAB — POCT I-STAT CREATININE: Creatinine, Ser: 0.8 mg/dL (ref 0.44–1.00)

## 2016-02-16 MED ORDER — GADOBENATE DIMEGLUMINE 529 MG/ML IV SOLN
14.0000 mL | Freq: Once | INTRAVENOUS | Status: AC | PRN
  Administered 2016-02-16: 14 mL via INTRAVENOUS

## 2016-02-23 ENCOUNTER — Other Ambulatory Visit: Payer: Self-pay | Admitting: Neurological Surgery

## 2016-03-01 ENCOUNTER — Encounter (HOSPITAL_COMMUNITY): Payer: Self-pay

## 2016-03-01 NOTE — Pre-Procedure Instructions (Signed)
Sandy Harper  03/01/2016      [facility] [place] - [address] [place] [id number] Phone: [phone] Fax: [phone]    Your procedure is scheduled on Tuesday October 10  [facility] Admitting at 0530 A.M.  Call this number if you have problems the morning of surgery:  [phone]   Remember:  Do not eat food or drink liquids after midnight.   Take these medicines the morning of surgery with A SIP OF WATER diazepam (VALIUM), gabapentin (NEURONTIN), HYDROcodone-acetaminophen (NORCO) , nortriptyline (PAMELOR), nitrofurantoin, macrocrystal-monohydrate, (MACROBID)  7 days prior to surgery STOP taking any Aspirin, MOBIC, Aleve, Naproxen, Ibuprofen, Motrin, Advil, Goody's, BC's, all herbal medications, fish oil, and all vitamins  WHAT DO I DO ABOUT MY DIABETES MEDICATION?   Marland Kitchen. Do not take oral diabetes medicines (pills) the morning of surgery. METFORMIN   How to Manage Your Diabetes Before and After Surgery  Why is it important to control my blood sugar before and after surgery? . Improving blood sugar levels before and after surgery helps healing and can limit problems. . A way of improving blood sugar control is eating a healthy diet by: o  Eating less sugar and carbohydrates o  Increasing activity/exercise o  Talking with your doctor about reaching your blood sugar goals . High blood sugars (greater than 180 mg/dL) can raise your risk of infections and slow your recovery, so you will need to focus on controlling your diabetes during the weeks before surgery. . Make sure that the doctor who takes care of your diabetes knows about your planned surgery including the date and location.  How do I manage my blood sugar before surgery? . Check your blood sugar at least 4 times a day, starting 2 days before surgery, to make sure that the level is not too high or low. o Check your blood sugar the morning of your  surgery when you wake up and every 2 hours until you get to the Short Stay unit. . If your blood sugar is less than 70 mg/dL, you will need to treat for low blood sugar: o Do not take insulin. o Treat a low blood sugar (less than 70 mg/dL) with  cup of clear juice (cranberry or apple), 4 glucose tablets, OR glucose gel. o Recheck blood sugar in 15 minutes after treatment (to make sure it is greater than 70 mg/dL). If your blood sugar is not greater than 70 mg/dL on recheck, call [phone] for further instructions. . Report your blood sugar to the short stay nurse when you get to Short Stay.  . If you are admitted to the hospital after surgery: o Your blood sugar will be checked by the staff and you will probably be given insulin after surgery (instead of oral diabetes medicines) to make sure you have good blood sugar levels. o The goal for blood sugar control after surgery is 80-180 mg/dL.    Do not wear jewelry, make-up or nail polish.  Do not wear lotions, powders, or perfumes, or deoderant.  Do not shave 48 hours prior to surgery.    Do not bring valuables to the hospital.  [facility] is not responsible for any belongings or valuables.  Contacts, dentures or bridgework may not be worn into surgery.  Leave your suitcase in the car.  After surgery it may be brought to your room.  For patients admitted to the hospital, discharge time will be determined  by your treatment team.  Patients discharged the day of surgery will not be allowed to drive home.    Special instructions:   [facility]- Preparing For Surgery  Before surgery, you can play an important role. Because skin is not sterile, your skin needs to be as free of germs as possible. You can reduce the number of germs on your skin by washing with CHG (chlorahexidine gluconate) Soap before surgery.  CHG is an antiseptic cleaner which kills germs and bonds with the skin to continue killing germs even after washing.  Please do not  use if you have an allergy to CHG or antibacterial soaps. If your skin becomes reddened/irritated stop using the CHG.  Do not shave (including legs and underarms) for at least 48 hours prior to first CHG shower. It is OK to shave your face.  Please follow these instructions carefully.   1. Shower the NIGHT BEFORE SURGERY and the MORNING OF SURGERY with CHG.   2. If you chose to wash your hair, wash your hair first as usual with your normal shampoo.  3. After you shampoo, rinse your hair and body thoroughly to remove the shampoo.  4. Use CHG as you would any other liquid soap. You can apply CHG directly to the skin and wash gently with a scrungie or a clean washcloth.   5. Apply the CHG Soap to your body ONLY FROM THE NECK DOWN.  Do not use on open wounds or open sores. Avoid contact with your eyes, ears, mouth and genitals (private parts). Wash genitals (private parts) with your normal soap.  6. Wash thoroughly, paying special attention to the area where your surgery will be performed.  7. Thoroughly rinse your body with warm water from the neck down.  8. DO NOT shower/wash with your normal soap after using and rinsing off the CHG Soap.  9. Pat yourself dry with a CLEAN TOWEL.   10. Wear CLEAN PAJAMAS   11. Place CLEAN SHEETS on your bed the night of your first shower and DO NOT SLEEP WITH PETS.    Day of Surgery: Do not apply any deodorants/lotions. Please wear clean clothes to the hospital/surgery center.      Please read over the following fact sheets that you were given. Coughing and Deep Breathing, Blood Transfusion Information, MRSA Information and Surgical Site Infection Prevention

## 2016-03-02 ENCOUNTER — Encounter (HOSPITAL_COMMUNITY): Payer: Self-pay

## 2016-03-02 ENCOUNTER — Encounter (HOSPITAL_COMMUNITY)
Admission: RE | Admit: 2016-03-02 | Discharge: 2016-03-02 | Disposition: A | Discharge status: Home / Self Care | Payer: Medicare Other | Source: Ambulatory Visit | Attending: Neurological Surgery | Admitting: Neurological Surgery

## 2016-03-02 DIAGNOSIS — Z01818 Encounter for other preprocedural examination: Secondary | ICD-10-CM | POA: Diagnosis present

## 2016-03-02 HISTORY — DX: Prediabetes: R73.03

## 2016-03-02 HISTORY — DX: Anxiety disorder, unspecified: F41.9

## 2016-03-02 HISTORY — DX: Personal history of urinary calculi: Z87.442

## 2016-03-02 HISTORY — DX: Unspecified urinary incontinence: R32

## 2016-03-02 HISTORY — DX: Essential (primary) hypertension: I10

## 2016-03-02 LAB — CBC
HCT: 44.7 % (ref 36.0–46.0)
Hemoglobin: 14.2 g/dL (ref 12.0–15.0)
MCH: 32.1 pg (ref 26.0–34.0)
MCHC: 31.8 g/dL (ref 30.0–36.0)
MCV: 101.1 fL — ABNORMAL HIGH (ref 78.0–100.0)
Platelets: 291 10*3/uL (ref 150–400)
RBC: 4.42 MIL/uL (ref 3.87–5.11)
RDW: 14 % (ref 11.5–15.5)
WBC: 7.2 10*3/uL (ref 4.0–10.5)

## 2016-03-02 LAB — SURGICAL PCR SCREEN
MRSA, PCR: POSITIVE — AB
STAPHYLOCOCCUS AUREUS: POSITIVE — AB

## 2016-03-02 LAB — BASIC METABOLIC PANEL
ANION GAP: 8 (ref 5–15)
BUN: 14 mg/dL (ref 6–20)
CALCIUM: 9.5 mg/dL (ref 8.9–10.3)
CO2: 26 mmol/L (ref 22–32)
Chloride: 104 mmol/L (ref 101–111)
Creatinine, Ser: 0.83 mg/dL (ref 0.44–1.00)
Glucose, Bld: 84 mg/dL (ref 65–99)
POTASSIUM: 4.3 mmol/L (ref 3.5–5.1)
SODIUM: 138 mmol/L (ref 135–145)

## 2016-03-02 LAB — TYPE AND SCREEN
ABO/RH(D): A POS
Antibody Screen: NEGATIVE

## 2016-03-02 LAB — GLUCOSE, CAPILLARY: Glucose-Capillary: 123 mg/dL — ABNORMAL HIGH (ref 65–99)

## 2016-03-02 MED ORDER — CHLORHEXIDINE GLUCONATE CLOTH 2 % EX PADS: 6.0000 | MEDICATED_PAD | Freq: Once | CUTANEOUS | Status: DC

## 2016-03-02 NOTE — Progress Notes (Addendum)
Patient said that she just recently learned she has UTI.  PA for her urologist placed her on nitrofurantoin, and has only taken 3 pills out of 14.  She denies any symptoms of a UTI   (sees Dr. Oneal DeputyMcDarren ?) She also would like it known, that she has really bad incontinence.  Will not be able to hold it. PCP is Dr. Roxy [place] [facility]  lov early part of 2017 Was told she was "pre diabetic"   Started out on metformin, however she only has blood sugar levels checked at PCP's office. Denies any cardiac issues, no cardiac tests done. Initial EKG read "stemi", pulled up 2014 & had it repeated and consulted with Rica MastAngela Kabbe, NP.

## 2016-03-03 LAB — HEMOGLOBIN A1C
HEMOGLOBIN A1C: 5.9 % — AB (ref 4.8–5.6)
MEAN PLASMA GLUCOSE: 123 mg/dL

## 2016-03-03 NOTE — Progress Notes (Signed)
Anesthesia Chart Review:  Pt is a 75 year old female scheduled for L3-4, L4-5 PLIF on 09/29/15 with Barnett AbuHenry Elsner, MD.   PMH includes:  HTN, heart murmur (not specified), pre-diabetes, hyperlipidemia, sleep disorder (not specified). Current smoker. BMI 28.5. S/p lumbar fusion 07/25/12.   Medications include: lisinopril, metformin  Preoperative labs reviewed.  HgbA1c 5.9, glucose 84  EKG 03/02/16: NSR. Possible LAE. Inferior infarct, age undetermined  Reviewed EKG with Dr. Jean RosenthalJackson.  If no changes, I anticipate pt can proceed with surgery as scheduled.   Rica Mastngela Kabbe, FNP-BC [facility]/Anesthesiology Phone: [phone] 03/03/2016 1:28 PM

## 2016-03-07 ENCOUNTER — Inpatient Hospital Stay (HOSPITAL_COMMUNITY): Payer: Medicare Other

## 2016-03-07 ENCOUNTER — Encounter (HOSPITAL_COMMUNITY): Admission: RE | Disposition: E | Payer: Self-pay | Source: Ambulatory Visit | Attending: Neurological Surgery

## 2016-03-07 ENCOUNTER — Inpatient Hospital Stay (HOSPITAL_COMMUNITY): Payer: Medicare Other | Admitting: Emergency Medicine

## 2016-03-07 ENCOUNTER — Encounter (HOSPITAL_COMMUNITY): Payer: Self-pay | Admitting: *Deleted

## 2016-03-07 ENCOUNTER — Inpatient Hospital Stay (HOSPITAL_COMMUNITY): Payer: Medicare Other | Admitting: Certified Registered Nurse Anesthetist

## 2016-03-07 ENCOUNTER — Inpatient Hospital Stay (HOSPITAL_COMMUNITY)
Admission: RE | Admit: 2016-03-07 | Discharge: 2016-03-29 | DRG: 460 | Disposition: E | Payer: Medicare Other | Source: Ambulatory Visit | Attending: Neurological Surgery | Admitting: Neurological Surgery

## 2016-03-07 DIAGNOSIS — M4316 Spondylolisthesis, lumbar region: Secondary | ICD-10-CM

## 2016-03-07 DIAGNOSIS — Z7984 Long term (current) use of oral hypoglycemic drugs: Secondary | ICD-10-CM | POA: Diagnosis not present

## 2016-03-07 DIAGNOSIS — M8580 Other specified disorders of bone density and structure, unspecified site: Secondary | ICD-10-CM | POA: Diagnosis present

## 2016-03-07 DIAGNOSIS — F17210 Nicotine dependence, cigarettes, uncomplicated: Secondary | ICD-10-CM | POA: Diagnosis present

## 2016-03-07 DIAGNOSIS — I469 Cardiac arrest, cause unspecified: Secondary | ICD-10-CM | POA: Diagnosis not present

## 2016-03-07 DIAGNOSIS — M5416 Radiculopathy, lumbar region: Secondary | ICD-10-CM | POA: Diagnosis present

## 2016-03-07 DIAGNOSIS — Z791 Long term (current) use of non-steroidal anti-inflammatories (NSAID): Secondary | ICD-10-CM | POA: Diagnosis not present

## 2016-03-07 DIAGNOSIS — Z8249 Family history of ischemic heart disease and other diseases of the circulatory system: Secondary | ICD-10-CM

## 2016-03-07 DIAGNOSIS — M48061 Spinal stenosis, lumbar region without neurogenic claudication: Secondary | ICD-10-CM | POA: Diagnosis present

## 2016-03-07 DIAGNOSIS — I10 Essential (primary) hypertension: Secondary | ICD-10-CM | POA: Diagnosis present

## 2016-03-07 DIAGNOSIS — E785 Hyperlipidemia, unspecified: Secondary | ICD-10-CM | POA: Diagnosis present

## 2016-03-07 DIAGNOSIS — M549 Dorsalgia, unspecified: Secondary | ICD-10-CM | POA: Diagnosis present

## 2016-03-07 DIAGNOSIS — Z419 Encounter for procedure for purposes other than remedying health state, unspecified: Secondary | ICD-10-CM

## 2016-03-07 DIAGNOSIS — Z833 Family history of diabetes mellitus: Secondary | ICD-10-CM | POA: Diagnosis not present

## 2016-03-07 LAB — GLUCOSE, CAPILLARY
Glucose-Capillary: 96 mg/dL (ref 65–99)
Glucose-Capillary: 166 mg/dL — ABNORMAL HIGH (ref 65–99)

## 2016-03-07 SURGERY — POSTERIOR LUMBAR FUSION 2 LEVEL
Anesthesia: General | Site: Back

## 2016-03-07 MED ORDER — PHENYLEPHRINE HCL 10 MG/ML IJ SOLN
INTRAVENOUS | Status: DC | PRN
  Administered 2016-03-07: 25 ug/min via INTRAVENOUS

## 2016-03-07 MED ORDER — LACTATED RINGERS IV SOLN
INTRAVENOUS | Status: DC | PRN
  Administered 2016-03-07 (×3): via INTRAVENOUS

## 2016-03-07 MED ORDER — LISINOPRIL 10 MG PO TABS
10.0000 mg | ORAL_TABLET | Freq: Every day | ORAL | Status: DC
  Filled 2016-03-07: qty 1

## 2016-03-07 MED ORDER — ACETAMINOPHEN 325 MG PO TABS: 650.0000 mg | ORAL_TABLET | ORAL | Status: DC | PRN

## 2016-03-07 MED ORDER — SODIUM CHLORIDE 0.9% FLUSH
3.0000 mL | Freq: Two times a day (BID) | INTRAVENOUS | Status: DC
  Administered 2016-03-08 (×2): 3 mL via INTRAVENOUS

## 2016-03-07 MED ORDER — LIDOCAINE-EPINEPHRINE 1 %-1:100000 IJ SOLN
INTRAMUSCULAR | Status: AC
  Filled 2016-03-07: qty 1

## 2016-03-07 MED ORDER — 0.9 % SODIUM CHLORIDE (POUR BTL) OPTIME
TOPICAL | Status: DC | PRN
  Administered 2016-03-07: 1000 mL

## 2016-03-07 MED ORDER — ACETAMINOPHEN 650 MG RE SUPP: 650.0000 mg | RECTAL | Status: DC | PRN

## 2016-03-07 MED ORDER — NITROFURANTOIN MONOHYD MACRO 100 MG PO CAPS
100.0000 mg | ORAL_CAPSULE | Freq: Two times a day (BID) | ORAL | Status: DC
  Administered 2016-03-08: 100 mg via ORAL
  Filled 2016-03-07 (×4): qty 1

## 2016-03-07 MED ORDER — MUPIROCIN 2 % EX OINT
1.0000 "application " | TOPICAL_OINTMENT | Freq: Two times a day (BID) | CUTANEOUS | Status: DC
  Administered 2016-03-07 – 2016-03-08 (×3): 1 via NASAL
  Filled 2016-03-07: qty 22

## 2016-03-07 MED ORDER — CHLORHEXIDINE GLUCONATE CLOTH 2 % EX PADS
6.0000 | MEDICATED_PAD | Freq: Every day | CUTANEOUS | Status: DC
  Administered 2016-03-08: 6 via TOPICAL

## 2016-03-07 MED ORDER — VANCOMYCIN HCL IN DEXTROSE 1-5 GM/200ML-% IV SOLN
INTRAVENOUS | Status: AC
  Filled 2016-03-07: qty 200

## 2016-03-07 MED ORDER — PROPOFOL 10 MG/ML IV BOLUS
INTRAVENOUS | Status: DC | PRN
  Administered 2016-03-07: 130 mg via INTRAVENOUS

## 2016-03-07 MED ORDER — ONDANSETRON HCL 4 MG/2ML IJ SOLN
INTRAMUSCULAR | Status: AC
  Filled 2016-03-07: qty 2

## 2016-03-07 MED ORDER — VANCOMYCIN HCL 1000 MG IV SOLR
INTRAVENOUS | Status: DC | PRN
  Administered 2016-03-07: 1000 mg via INTRAVENOUS

## 2016-03-07 MED ORDER — ROCURONIUM BROMIDE 10 MG/ML (PF) SYRINGE
PREFILLED_SYRINGE | INTRAVENOUS | Status: AC
  Filled 2016-03-07: qty 10

## 2016-03-07 MED ORDER — ALBUMIN HUMAN 5 % IV SOLN
INTRAVENOUS | Status: DC | PRN
  Administered 2016-03-07 (×2): via INTRAVENOUS

## 2016-03-07 MED ORDER — FENTANYL CITRATE (PF) 100 MCG/2ML IJ SOLN
INTRAMUSCULAR | Status: AC
  Filled 2016-03-07: qty 4

## 2016-03-07 MED ORDER — LIDOCAINE-EPINEPHRINE 1 %-1:100000 IJ SOLN
INTRAMUSCULAR | Status: DC | PRN
  Administered 2016-03-07: 5 mL

## 2016-03-07 MED ORDER — ONDANSETRON HCL 4 MG/2ML IJ SOLN
4.0000 mg | INTRAMUSCULAR | Status: DC | PRN
Start: 2016-03-07 — End: 2016-03-09

## 2016-03-07 MED ORDER — DIAZEPAM 5 MG PO TABS
5.0000 mg | ORAL_TABLET | Freq: Three times a day (TID) | ORAL | Status: DC | PRN
  Administered 2016-03-07: 5 mg via ORAL

## 2016-03-07 MED ORDER — SODIUM CHLORIDE 0.9 % IV SOLN: 250.0000 mL | INTRAVENOUS | Status: DC

## 2016-03-07 MED ORDER — EPHEDRINE SULFATE 50 MG/ML IJ SOLN
INTRAMUSCULAR | Status: DC | PRN
  Administered 2016-03-07: 5 mg via INTRAVENOUS
  Administered 2016-03-07 (×2): 10 mg via INTRAVENOUS

## 2016-03-07 MED ORDER — SODIUM CHLORIDE 0.9 % IN NEBU
INHALATION_SOLUTION | RESPIRATORY_TRACT | Status: AC
  Filled 2016-03-07: qty 3

## 2016-03-07 MED ORDER — ROCURONIUM BROMIDE 100 MG/10ML IV SOLN
INTRAVENOUS | Status: DC | PRN
  Administered 2016-03-07: 20 mg via INTRAVENOUS
  Administered 2016-03-07: 40 mg via INTRAVENOUS
  Administered 2016-03-07: 25 mg via INTRAVENOUS
  Administered 2016-03-07 (×2): 20 mg via INTRAVENOUS

## 2016-03-07 MED ORDER — HYDROMORPHONE HCL 1 MG/ML IJ SOLN: 0.2500 mg | INTRAMUSCULAR | Status: DC | PRN

## 2016-03-07 MED ORDER — KETOROLAC TROMETHAMINE 15 MG/ML IJ SOLN
INTRAMUSCULAR | Status: AC
  Administered 2016-03-07: 15 mg via INTRAVENOUS
  Filled 2016-03-07: qty 1

## 2016-03-07 MED ORDER — DARIFENACIN HYDROBROMIDE ER 7.5 MG PO TB24
7.5000 mg | ORAL_TABLET | Freq: Every day | ORAL | Status: DC
  Administered 2016-03-07 – 2016-03-08 (×2): 7.5 mg via ORAL
  Filled 2016-03-07 (×2): qty 1

## 2016-03-07 MED ORDER — KETOROLAC TROMETHAMINE 15 MG/ML IJ SOLN
15.0000 mg | Freq: Four times a day (QID) | INTRAMUSCULAR | Status: AC
  Administered 2016-03-07 – 2016-03-08 (×5): 15 mg via INTRAVENOUS
  Filled 2016-03-07 (×4): qty 1

## 2016-03-07 MED ORDER — DEXAMETHASONE SODIUM PHOSPHATE 10 MG/ML IJ SOLN
INTRAMUSCULAR | Status: AC
  Filled 2016-03-07: qty 1

## 2016-03-07 MED ORDER — LIDOCAINE HCL 4 % EX SOLN
CUTANEOUS | Status: DC | PRN
  Administered 2016-03-07: 3 mL via TOPICAL

## 2016-03-07 MED ORDER — GABAPENTIN 300 MG PO CAPS
300.0000 mg | ORAL_CAPSULE | Freq: Three times a day (TID) | ORAL | Status: DC
  Administered 2016-03-07 – 2016-03-08 (×4): 300 mg via ORAL
  Filled 2016-03-07 (×4): qty 1

## 2016-03-07 MED ORDER — SODIUM CHLORIDE 0.9% FLUSH: 3.0000 mL | INTRAVENOUS | Status: DC | PRN

## 2016-03-07 MED ORDER — SUGAMMADEX SODIUM 200 MG/2ML IV SOLN
INTRAVENOUS | Status: DC | PRN
  Administered 2016-03-07: 200 mg via INTRAVENOUS

## 2016-03-07 MED ORDER — NITROFURANTOIN MACROCRYSTAL 100 MG PO CAPS: 100.0000 mg | ORAL_CAPSULE | Freq: Two times a day (BID) | ORAL | Status: DC

## 2016-03-07 MED ORDER — FENTANYL CITRATE (PF) 100 MCG/2ML IJ SOLN
INTRAMUSCULAR | Status: DC | PRN
  Administered 2016-03-07 (×6): 50 ug via INTRAVENOUS

## 2016-03-07 MED ORDER — POLYETHYLENE GLYCOL 3350 17 G PO PACK: 17.0000 g | PACK | Freq: Every day | ORAL | Status: DC | PRN

## 2016-03-07 MED ORDER — MENTHOL 3 MG MT LOZG: 1.0000 | LOZENGE | OROMUCOSAL | Status: DC | PRN

## 2016-03-07 MED ORDER — SODIUM CHLORIDE 0.9 % IV SOLN
INTRAVENOUS | Status: DC
  Administered 2016-03-07: 1000 mL via INTRAVENOUS
  Administered 2016-03-08 (×2): via INTRAVENOUS

## 2016-03-07 MED ORDER — ALUM & MAG HYDROXIDE-SIMETH 200-200-20 MG/5ML PO SUSP: 30.0000 mL | Freq: Four times a day (QID) | ORAL | Status: DC | PRN

## 2016-03-07 MED ORDER — HYDROMORPHONE HCL 1 MG/ML IJ SOLN
INTRAMUSCULAR | Status: AC
  Filled 2016-03-07: qty 1

## 2016-03-07 MED ORDER — PSYLLIUM 95 % PO PACK
1.0000 | PACK | Freq: Every day | ORAL | Status: DC
  Administered 2016-03-07 – 2016-03-08 (×2): 1 via ORAL
  Filled 2016-03-07 (×3): qty 1

## 2016-03-07 MED ORDER — THROMBIN 5000 UNITS EX SOLR
OROMUCOSAL | Status: DC | PRN
  Administered 2016-03-07 (×2): via TOPICAL

## 2016-03-07 MED ORDER — THROMBIN 20000 UNITS EX SOLR
CUTANEOUS | Status: DC | PRN
  Administered 2016-03-07: 09:00:00 via TOPICAL

## 2016-03-07 MED ORDER — DOCUSATE SODIUM 100 MG PO CAPS
100.0000 mg | ORAL_CAPSULE | Freq: Two times a day (BID) | ORAL | Status: DC
  Administered 2016-03-07 – 2016-03-08 (×3): 100 mg via ORAL
  Filled 2016-03-07 (×3): qty 1

## 2016-03-07 MED ORDER — THROMBIN 5000 UNITS EX SOLR
CUTANEOUS | Status: AC
  Filled 2016-03-07: qty 5000

## 2016-03-07 MED ORDER — KETOROLAC TROMETHAMINE 0.5 % OP SOLN
1.0000 [drp] | Freq: Four times a day (QID) | OPHTHALMIC | Status: DC
  Administered 2016-03-07 – 2016-03-08 (×3): 1 [drp] via OPHTHALMIC
  Filled 2016-03-07: qty 3

## 2016-03-07 MED ORDER — THROMBIN 20000 UNITS EX SOLR
CUTANEOUS | Status: AC
Start: 2016-03-07 — End: 2016-03-07
  Filled 2016-03-07: qty 20000

## 2016-03-07 MED ORDER — CEFAZOLIN SODIUM-DEXTROSE 2-4 GM/100ML-% IV SOLN
INTRAVENOUS | Status: AC
  Filled 2016-03-07: qty 100

## 2016-03-07 MED ORDER — SODIUM CHLORIDE 0.9 % IR SOLN
Status: DC | PRN
  Administered 2016-03-07: 09:00:00

## 2016-03-07 MED ORDER — HYDROCODONE-ACETAMINOPHEN 10-325 MG PO TABS
0.5000 | ORAL_TABLET | Freq: Two times a day (BID) | ORAL | Status: DC
  Administered 2016-03-07 – 2016-03-08 (×3): 1 via ORAL
  Filled 2016-03-07 (×3): qty 1

## 2016-03-07 MED ORDER — FLEET ENEMA 7-19 GM/118ML RE ENEM: 1.0000 | ENEMA | Freq: Once | RECTAL | Status: DC | PRN

## 2016-03-07 MED ORDER — LIDOCAINE HCL (CARDIAC) 20 MG/ML IV SOLN
INTRAVENOUS | Status: DC | PRN
  Administered 2016-03-07: 60 mg via INTRAVENOUS

## 2016-03-07 MED ORDER — DIAZEPAM 5 MG PO TABS
ORAL_TABLET | ORAL | Status: AC
  Administered 2016-03-07: 5 mg via ORAL
  Filled 2016-03-07: qty 1

## 2016-03-07 MED ORDER — PHENYLEPHRINE HCL 10 MG/ML IJ SOLN
INTRAMUSCULAR | Status: DC | PRN
  Administered 2016-03-07 (×4): 40 ug via INTRAVENOUS

## 2016-03-07 MED ORDER — ONDANSETRON HCL 4 MG/2ML IJ SOLN
INTRAMUSCULAR | Status: DC | PRN
  Administered 2016-03-07: 4 mg via INTRAVENOUS

## 2016-03-07 MED ORDER — ZOLPIDEM TARTRATE 5 MG PO TABS
10.0000 mg | ORAL_TABLET | Freq: Every evening | ORAL | Status: DC | PRN
  Administered 2016-03-07 – 2016-03-08 (×2): 10 mg via ORAL
  Filled 2016-03-07 (×2): qty 2

## 2016-03-07 MED ORDER — PHENOL 1.4 % MT LIQD
1.0000 | OROMUCOSAL | Status: DC | PRN
Start: 2016-03-07 — End: 2016-03-09

## 2016-03-07 MED ORDER — DEXAMETHASONE SODIUM PHOSPHATE 10 MG/ML IJ SOLN
INTRAMUSCULAR | Status: DC | PRN
  Administered 2016-03-07: 10 mg via INTRAVENOUS

## 2016-03-07 MED ORDER — METFORMIN HCL 500 MG PO TABS
250.0000 mg | ORAL_TABLET | Freq: Two times a day (BID) | ORAL | Status: DC
  Administered 2016-03-07 – 2016-03-08 (×3): 250 mg via ORAL
  Filled 2016-03-07 (×3): qty 1

## 2016-03-07 MED ORDER — VANCOMYCIN HCL IN DEXTROSE 1-5 GM/200ML-% IV SOLN
1000.0000 mg | Freq: Once | INTRAVENOUS | Status: AC
  Administered 2016-03-08: 1000 mg via INTRAVENOUS
  Filled 2016-03-07: qty 200

## 2016-03-07 MED ORDER — SENNA 8.6 MG PO TABS
1.0000 | ORAL_TABLET | Freq: Two times a day (BID) | ORAL | Status: DC
  Administered 2016-03-07 – 2016-03-08 (×3): 8.6 mg via ORAL
  Filled 2016-03-07 (×3): qty 1

## 2016-03-07 MED ORDER — PROPOFOL 10 MG/ML IV BOLUS
INTRAVENOUS | Status: AC
  Filled 2016-03-07: qty 20

## 2016-03-07 MED ORDER — FENTANYL CITRATE (PF) 100 MCG/2ML IJ SOLN
INTRAMUSCULAR | Status: AC
  Filled 2016-03-07: qty 2

## 2016-03-07 MED ORDER — PHENYLEPHRINE 40 MCG/ML (10ML) SYRINGE FOR IV PUSH (FOR BLOOD PRESSURE SUPPORT)
PREFILLED_SYRINGE | INTRAVENOUS | Status: AC
  Filled 2016-03-07: qty 10

## 2016-03-07 MED ORDER — GLYCOPYRROLATE 0.2 MG/ML IJ SOLN
INTRAMUSCULAR | Status: DC | PRN
  Administered 2016-03-07: 0.2 mg via INTRAVENOUS

## 2016-03-07 MED ORDER — BUPIVACAINE HCL (PF) 0.5 % IJ SOLN
INTRAMUSCULAR | Status: DC | PRN
  Administered 2016-03-07: 5 mL
  Administered 2016-03-07: 25 mL

## 2016-03-07 MED ORDER — CEFAZOLIN SODIUM-DEXTROSE 2-4 GM/100ML-% IV SOLN
2.0000 g | INTRAVENOUS | Status: AC
  Administered 2016-03-07: 2 g via INTRAVENOUS

## 2016-03-07 MED ORDER — BISACODYL 10 MG RE SUPP: 10.0000 mg | Freq: Every day | RECTAL | Status: DC | PRN

## 2016-03-07 MED FILL — Sodium Chloride IV Soln 0.9%: INTRAVENOUS | Qty: 1000 | Status: AC

## 2016-03-07 MED FILL — Heparin Sodium (Porcine) Inj 1000 Unit/ML: INTRAMUSCULAR | Qty: 30 | Status: AC

## 2016-03-07 SURGICAL SUPPLY — 81 items
ADH SKN CLS APL DERMABOND .7 (GAUZE/BANDAGES/DRESSINGS) ×1
APL SRG 60D 8 XTD TIP BNDBL (TIP)
BAG DECANTER FOR FLEXI CONT (MISCELLANEOUS) ×3 IMPLANT
BLADE CLIPPER SURG (BLADE) IMPLANT
BUR MATCHSTICK NEURO 3.0 LAGG (BURR) ×3 IMPLANT
CAGE COROENT LRG MP 8X9X28-12 (Cage) ×4 IMPLANT
CAGE COROENT MP 8X9X23M-8 SPIN (Cage) ×4 IMPLANT
CANISTER SUCT 3000ML PPV (MISCELLANEOUS) ×3 IMPLANT
CONT SPEC 4OZ CLIKSEAL STRL BL (MISCELLANEOUS) ×3 IMPLANT
COVER BACK TABLE 60X90IN (DRAPES) ×3 IMPLANT
DECANTER SPIKE VIAL GLASS SM (MISCELLANEOUS) ×3 IMPLANT
DERMABOND ADVANCED (GAUZE/BANDAGES/DRESSINGS) ×2
DERMABOND ADVANCED .7 DNX12 (GAUZE/BANDAGES/DRESSINGS) ×1 IMPLANT
DEVICE DISSECT PLASMABLAD 3.0S (MISCELLANEOUS) ×1 IMPLANT
DRAPE C-ARM 42X72 X-RAY (DRAPES) ×6 IMPLANT
DRAPE HALF SHEET 40X57 (DRAPES) ×2 IMPLANT
DRAPE LAPAROTOMY 100X72X124 (DRAPES) ×3 IMPLANT
DRAPE POUCH INSTRU U-SHP 10X18 (DRAPES) ×3 IMPLANT
DRSG OPSITE POSTOP 4X8 (GAUZE/BANDAGES/DRESSINGS) ×2 IMPLANT
DURAPREP 26ML APPLICATOR (WOUND CARE) ×3 IMPLANT
DURASEAL APPLICATOR TIP (TIP) IMPLANT
DURASEAL SPINE SEALANT 3ML (MISCELLANEOUS) IMPLANT
ELECT REM PT RETURN 9FT ADLT (ELECTROSURGICAL) ×3
ELECTRODE REM PT RTRN 9FT ADLT (ELECTROSURGICAL) ×1 IMPLANT
GAUZE SPONGE 4X4 12PLY STRL (GAUZE/BANDAGES/DRESSINGS) ×1 IMPLANT
GAUZE SPONGE 4X4 16PLY XRAY LF (GAUZE/BANDAGES/DRESSINGS) ×2 IMPLANT
GLOVE BIOGEL PI IND STRL 6.5 (GLOVE) IMPLANT
GLOVE BIOGEL PI IND STRL 7.0 (GLOVE) IMPLANT
GLOVE BIOGEL PI IND STRL 7.5 (GLOVE) IMPLANT
GLOVE BIOGEL PI IND STRL 8 (GLOVE) IMPLANT
GLOVE BIOGEL PI IND STRL 8.5 (GLOVE) ×2 IMPLANT
GLOVE BIOGEL PI INDICATOR 6.5 (GLOVE) ×2
GLOVE BIOGEL PI INDICATOR 7.0 (GLOVE)
GLOVE BIOGEL PI INDICATOR 7.5 (GLOVE) ×4
GLOVE BIOGEL PI INDICATOR 8 (GLOVE) ×8
GLOVE BIOGEL PI INDICATOR 8.5 (GLOVE) ×4
GLOVE ECLIPSE 7.5 STRL STRAW (GLOVE) ×6 IMPLANT
GLOVE ECLIPSE 8.5 STRL (GLOVE) ×6 IMPLANT
GLOVE SS BIOGEL STRL SZ 7 (GLOVE) IMPLANT
GLOVE SUPERSENSE BIOGEL SZ 7 (GLOVE) ×2
GLOVE SURG SS PI 6.5 STRL IVOR (GLOVE) ×4 IMPLANT
GLOVE SURG SS PI 7.0 STRL IVOR (GLOVE) ×2 IMPLANT
GOWN STRL REUS W/ TWL LRG LVL3 (GOWN DISPOSABLE) IMPLANT
GOWN STRL REUS W/ TWL XL LVL3 (GOWN DISPOSABLE) IMPLANT
GOWN STRL REUS W/TWL 2XL LVL3 (GOWN DISPOSABLE) ×10 IMPLANT
GOWN STRL REUS W/TWL LRG LVL3 (GOWN DISPOSABLE) ×6
GOWN STRL REUS W/TWL XL LVL3 (GOWN DISPOSABLE) ×3
GRAFT BN 10X1XDBM MAGNIFUSE (Bone Implant) IMPLANT
GRAFT BONE MAGNIFUSE 1X10CM (Bone Implant) ×3 IMPLANT
HEMOSTAT POWDER KIT SURGIFOAM (HEMOSTASIS) ×4 IMPLANT
KIT BASIN OR (CUSTOM PROCEDURE TRAY) ×3 IMPLANT
KIT ROOM TURNOVER OR (KITS) ×3 IMPLANT
MILL MEDIUM DISP (BLADE) ×2 IMPLANT
MODULE POWER NUVASIVE (MISCELLANEOUS) IMPLANT
NDL SPNL 18GX3.5 QUINCKE PK (NEEDLE) IMPLANT
NEEDLE HYPO 22GX1.5 SAFETY (NEEDLE) ×3 IMPLANT
NEEDLE SPNL 18GX3.5 QUINCKE PK (NEEDLE) ×3 IMPLANT
NS IRRIG 1000ML POUR BTL (IV SOLUTION) ×3 IMPLANT
PACK LAMINECTOMY NEURO (CUSTOM PROCEDURE TRAY) ×3 IMPLANT
PAD ARMBOARD 7.5X6 YLW CONV (MISCELLANEOUS) ×9 IMPLANT
PATTIES SURGICAL .5 X1 (DISPOSABLE) ×1 IMPLANT
PATTIES SURGICAL 1X1 (DISPOSABLE) ×2 IMPLANT
PLASMABLADE 3.0S (MISCELLANEOUS) ×3
POWER MODULE NUVASIVE (MISCELLANEOUS) ×3
ROD RELINE-O LORDOTIC 5.5X60MM (Rod) ×4 IMPLANT
SCREW LOCK RELINE 5.5 TULIP (Screw) ×12 IMPLANT
SCREW RELINE-O POLY 6.5X45 (Screw) ×12 IMPLANT
SPONGE LAP 4X18 X RAY DECT (DISPOSABLE) IMPLANT
SPONGE SURGIFOAM ABS GEL 100 (HEMOSTASIS) ×3 IMPLANT
SUT PROLENE 6 0 BV (SUTURE) IMPLANT
SUT VIC AB 1 CT1 18XBRD ANBCTR (SUTURE) ×1 IMPLANT
SUT VIC AB 1 CT1 8-18 (SUTURE) ×6
SUT VIC AB 2-0 CP2 18 (SUTURE) ×5 IMPLANT
SUT VIC AB 3-0 SH 8-18 (SUTURE) ×5 IMPLANT
SYR 3ML LL SCALE MARK (SYRINGE) ×12 IMPLANT
SYR 5ML LL (SYRINGE) IMPLANT
TOWEL OR 17X24 6PK STRL BLUE (TOWEL DISPOSABLE) ×3 IMPLANT
TOWEL OR 17X26 10 PK STRL BLUE (TOWEL DISPOSABLE) ×3 IMPLANT
TRAP SPECIMEN MUCOUS 40CC (MISCELLANEOUS) ×3 IMPLANT
TRAY FOLEY W/METER SILVER 16FR (SET/KITS/TRAYS/PACK) ×3 IMPLANT
WATER STERILE IRR 1000ML POUR (IV SOLUTION) ×3 IMPLANT

## 2016-03-07 NOTE — Progress Notes (Signed)
Patient ID: Sandy NeedleJoan C Verstraete, female   DOB: 07/05/1940, 75 y.o.   MRN: [id number] Vital signs good  Motor function ok in lower extremities Dressing dry C/o some scratchiness in left  eye, covered with compress, will inform anesthesia

## 2016-03-07 NOTE — Anesthesia Preprocedure Evaluation (Signed)
Anesthesia Evaluation  Patient identified by MRN, date of birth, ID band Patient awake    Reviewed: Allergy & Precautions, NPO status , reviewed documented beta blocker date and time   Airway Mallampati: II  TM Distance: >3 FB     Dental   Pulmonary Current Smoker,    breath sounds clear to auscultation       Cardiovascular hypertension,  Rhythm:Regular Rate:Normal     Neuro/Psych    GI/Hepatic Neg liver ROS, GERD  ,  Endo/Other    Renal/GU Renal disease     Musculoskeletal  (+) Arthritis ,   Abdominal   Peds  Hematology   Anesthesia Other Findings   Reproductive/Obstetrics                             Anesthesia Physical Anesthesia Plan  ASA: III  Anesthesia Plan: General   Post-op Pain Management:    Induction: Intravenous  Airway Management Planned: Oral ETT  Additional Equipment:   Intra-op Plan:   Post-operative Plan: Possible Post-op intubation/ventilation  Informed Consent: I have reviewed the patients History and Physical, chart, labs and discussed the procedure including the risks, benefits and alternatives for the proposed anesthesia with the patient or authorized representative who has indicated his/her understanding and acceptance.   Dental advisory given  Plan Discussed with: CRNA and Anesthesiologist  Anesthesia Plan Comments:         Anesthesia Quick Evaluation

## 2016-03-07 NOTE — H&P (Signed)
Sandy Harper is an 75 y.o. female.   Chief Complaint: Back and bilateral leg pain HPI: Patient is a 75 year old individual who's previously undergone surgical decompression and stabilization at L1-L2 secondary to a degenerative spondylolisthesis. She is developed further back pain and lower extremity weakness distally and was found to have advanced her degenerative process at L3-4 and L4-5 she has a spondylolisthesis at each of these levels bilateral lateral recess stenosis in addition to central canal stenosis. She's been through extensive efforts at conservative management over the last several years and failing this she is now admitted for surgical decompression and stabilization.  Past Medical History:  Diagnosis Date  . Anxiety   . Degenerative joint disease    lumbar region & L hand - cortisone injection in past   . Depression   . Heart murmur    echocardiogram- 10 yrs. ago- wnl  . Heart palpitations 2001   due to stress - pt. feels that it has resolved & it was related to stress at that time  . History of kidney stones    Embedded in her kidneys  . Hyperglycemia   . Hyperlipidemia   . Hypertension    recently diagnosed  . Kidney stones    With lithotripsy and stent  . Osteopenia   . Pre-diabetes    last couple of yrs  . Sleep disorder   . Tobacco abuse   . Urinary incontinence   . Urine WBC increased 2005   Urology work-normal  . Vitiligo     Past Surgical History:  Procedure Laterality Date  . BACK SURGERY  02/11   1995- decompression, then followed by fusion in 2011  . BREAST BIOPSY     X 2 left, benign  . CARPAL TUNNEL RELEASE     bilateral  . HAND SURGERY  02/10  . HEMORRHOID SURGERY    . OOPHORECTOMY     Left cyst  . TRIGGER FINGER RELEASE  2003   Right hand  . TUBAL LIGATION      Family History  Problem Relation Age of Onset  . Heart disease Father     MI  . Cancer Sister     uterine cancer  . Cancer Brother     colon  . Diabetes Brother   .  Heart disease Brother     CAD  . Diabetes Brother   . Cancer Brother     prostate  . Diabetes Brother    Social History:  reports that she has been smoking Cigarettes.  She has a 25.00 pack-year smoking history. She has never used smokeless tobacco. She reports that she drinks alcohol. She reports that she does not use drugs.  Allergies:  Allergies  Allergen Reactions  . Alendronate Sodium Other (See Comments)    reflux  . Bupropion Hcl Palpitations     heart palpitations    Medications Prior to Admission  Medication Sig Dispense Refill  . conjugated estrogens (PREMARIN) vaginal cream Apply a small amount (finger tip size) to external vaginal area as needed up to twice weekly for vulvar symptoms (Patient taking differently: Place 1 Applicatorful vaginally as directed. Apply a small amount (finger tip size) to external vaginal area as needed twice monthly for vulvar symptoms) 30 g 3  . diazepam (VALIUM) 5 MG tablet Take 5 mg by mouth 3 (three) times daily as needed for muscle spasms.    Marland Kitchen gabapentin (NEURONTIN) 300 MG capsule TAKE TWO CAPSULES THREE TIMES DAILY 180 capsule 11  .  HYDROcodone-acetaminophen (NORCO) 10-325 MG tablet Take 0.5-1 tablets by mouth 2 (two) times daily.  0  . lisinopril (PRINIVIL,ZESTRIL) 10 MG tablet Take 1 tablet (10 mg total) by mouth daily. 30 tablet 11  . meloxicam (MOBIC) 15 MG tablet TAKE ONE (1) TABLET EACH DAY 30 tablet 11  . metFORMIN (GLUCOPHAGE) 500 MG tablet Take 0.5 tablets (250 mg total) by mouth 2 (two) times daily with a meal. (Patient taking differently: Take 250 mg by mouth 2 (two) times daily. ) 30 tablet 11  . nitrofurantoin (MACRODANTIN) 100 MG capsule Take 100 mg by mouth 2 (two) times daily.    . nitrofurantoin, macrocrystal-monohydrate, (MACROBID) 100 MG capsule Take 100 mg by mouth 2 (two) times daily.    . psyllium (METAMUCIL) 58.6 % powder Take 1 packet by mouth daily.    . solifenacin (VESICARE) 5 MG tablet Take 5 mg by mouth daily.     Marland Kitchen. zolpidem (AMBIEN) 10 MG tablet TAKE ONE (1) TABLET AT BEDTIME AS NEEDEDFOR SLEEP (Patient taking differently: Take 10 mg by mouth at bedtime. ) 30 tablet 3  . cholecalciferol (VITAMIN D) 1000 units tablet Take 2,000 Units by mouth daily.    . nortriptyline (PAMELOR) 10 MG capsule Take 2 tablets at bedtime. (Patient not taking: Reported on 03/01/2016) 60 capsule 2    Results for orders placed or performed during the hospital encounter of Jun 24, 2015 (from the past 48 hour(s))  Glucose, capillary     Status: None   Collection Time: Jun 24, 2015  6:05 AM  Result Value Ref Range   Glucose-Capillary 96 65 - 99 mg/dL   No results found.  Review of Systems  HENT: Negative.   Eyes: Negative.   Respiratory: Negative.   Cardiovascular: Negative.   Gastrointestinal: Negative.   Genitourinary: Negative.   Musculoskeletal: Positive for back pain.  Skin: Negative.   Neurological: Positive for tingling, focal weakness and weakness.  Psychiatric/Behavioral: Negative.     Blood pressure (!) 115/55, pulse 67, temperature 97.9 F (36.6 C), temperature source Oral, resp. rate 20, height 5\' 2"  (1.575 m), weight 70.8 kg (156 lb), last menstrual period 05/30/1995, SpO2 96 %. Physical Exam  Constitutional: She is oriented to person, place, and time. She appears well-developed and well-nourished.  HENT:  Head: Normocephalic and atraumatic.  Eyes: Conjunctivae and EOM are normal. Pupils are equal, round, and reactive to light.  Neck: Normal range of motion. Neck supple.  Cardiovascular: Normal rate and regular rhythm.   Respiratory: Effort normal and breath sounds normal.  GI: Soft. Bowel sounds are normal.  Musculoskeletal:  Tenderness to palpation lower lumbar spine. Straight leg raising positive at 45 in either lower extremity.  Neurological: She is alert and oriented to person, place, and time.  Moderate weakness in tibialis anterior  Bilaterally\ Iliopsoas quadriceps 4+ out of 5 bilaterally. Absent  Achilles reflexes absent patellar reflexes. Cranial nerve examination is normal.  Skin: Skin is warm and dry.  Psychiatric: She has a normal mood and affect. Her behavior is normal. Judgment and thought content normal.     Assessment/Plan Spondylolisthesis and stenosis L3-4 L4-5.  Posterior lumbar decompression arthrodesis L3-4 L4-5.  Stefani DamaELSNER,Hussain Maimone J, MD 01/22/16, 7:21 AM

## 2016-03-07 NOTE — Anesthesia Postprocedure Evaluation (Signed)
Anesthesia Post Note  Patient: Sandy Harper  Procedure(s) Performed: Procedure(s) (LRB): Lumbar three-four, Lumbar four-five Posterior lumbar interbody fusion (N/A)  Patient location during evaluation: PACU Anesthesia Type: General Level of consciousness: awake Pain management: pain level controlled Vital Signs Assessment: post-procedure vital signs reviewed and stable Respiratory status: spontaneous breathing Cardiovascular status: stable Anesthetic complications: no    Last Vitals:  Vitals:   03/25/2016 1257 03/02/2016 1300  BP: (!) 84/61 (!) 94/53  Pulse: 86 86  Resp: 13 17  Temp:  37 C    Last Pain:  Vitals:   03/15/2016 0644  TempSrc:   PainSc: 5                  EDWARDS,Alexei Doswell

## 2016-03-07 NOTE — Addendum Note (Signed)
Addendum  created 10-13-2015 1752 by Judie Petitharlene Edwards, MD   Order sets accessed

## 2016-03-07 NOTE — Transfer of Care (Signed)
Immediate Anesthesia Transfer of Care Note  Patient: Sandy Harper  Procedure(s) Performed: Procedure(s): Lumbar three-four, Lumbar four-five Posterior lumbar interbody fusion (N/A)  Patient Location: PACU  Anesthesia Type:General  Level of Consciousness: awake and alert   Airway & Oxygen Therapy: Patient Spontanous Breathing and Patient connected to nasal cannula oxygen  Post-op Assessment: Report given to RN, Post -op Vital signs reviewed and stable and Patient moving all extremities X 4  Post vital signs: Reviewed and stable  Last Vitals:  Vitals:   09-30-2015 0603  BP: (!) 115/55  Pulse: 67  Resp: 20  Temp: 36.6 C    Last Pain:  Vitals:   09-30-2015 0644  TempSrc:   PainSc: 5       Patients Stated Pain Goal: 6 (09-30-2015 [id number])  Complications: No apparent anesthesia complications

## 2016-03-07 NOTE — Addendum Note (Signed)
Addendum  created 06-22-2015 1814 by Judie Petitharlene Edwards, MD   Anesthesia Event edited

## 2016-03-07 NOTE — Anesthesia Procedure Notes (Signed)
Procedure Name: Intubation Date/Time: 05-04-2016 7:38 AM Performed by: Little IshikawaMERCER, Nichlas Pitera L Pre-anesthesia Checklist: Patient identified, Emergency Drugs available, Suction available and Patient being monitored Patient Re-evaluated:Patient Re-evaluated prior to inductionOxygen Delivery Method: Circle System Utilized Preoxygenation: Pre-oxygenation with 100% oxygen Intubation Type: IV induction Ventilation: Mask ventilation without difficulty Laryngoscope Size: Mac and 3 Grade View: Grade I Tube type: Oral Tube size: 7.0 mm Number of attempts: 1 Airway Equipment and Method: Stylet and Oral airway Placement Confirmation: ETT inserted through vocal cords under direct vision,  positive ETCO2 and breath sounds checked- equal and bilateral Secured at: 21 cm Tube secured with: Tape Dental Injury: Teeth and Oropharynx as per pre-operative assessment

## 2016-03-07 NOTE — Op Note (Signed)
Date of surgery: 02/27/2016 Preoperative diagnosis: Spondylolisthesis L3-4 and L4-5 with stenosis, lumbar radiculopathy. Status post decompression arthrodesis L1-2 and L2-3 Postoperative diagnosis: Same Procedure: Laminectomy L3 and L4 with decompression of the L3-L4 and L5 nerve roots with more work than required for simple interbody arthrodesis. Posterior lumbar interbody arthrodesis L3-4 and L4-5 with peek spacers local autograft, pedicle screw fixation L3-L5 with local autograft and allograft arthrodesis posterior laterally. Surgeon: Barnett Abu First assistant: Cherrie Distance M.D. Anesthesia: Gen. endotracheal Indications: Ms. Sandy Harper is a 75 year old individual who's had previous spondylosis and stenosis in her lumbar spine. She has undergone previous decompressions and fusions at L2-3 then at L1 to with laminectomy at L4-5 in the past. She has developed progressive degenerative spondylolisthesis at L3-4 and L4-5 with severe stenosis. She is advised regarding surgery to decompress and stabilize her lumbar spine.  Procedure: Patient was brought to the operating room supine on a stretcher. After the smooth induction of general endotracheal anesthesia, she was turned prone. The back was prepped with alcohol and DuraPrep and draped in a sterile fashion. A midline incision was created and carried down to the lumbar dorsal fascia which was opened on either side of midline. Then the spinous processes of L3 and L4 were identified positively with the radiograph. The dissection was then carried out over the facet joints to the transverse processes from L3 down to L5. Hemostasis was obtained meticulously as the dissection progressed. Significant soft tissue overgrowth from the facet joints was removed. The lamina were then examined in a laminectomy removing the inferior margin lamina of L3 out to and including the entirety of the facet joint at L3-4 was performed. Total laminectomy of L4 was then performed.  The underlying common dural tube was then carefully exposed Lord and there was significant adhesions at the L4-L5 level where the previous laminectomy occurred on the left side. There is also some adhesions of the dura to the surrounding fascia and particularly around the L4 nerve root on the left side there was substantial fibrosis from previous surgery. This was all decompressed carefully with a high-speed drill and 2 mm Kerrison punch. The nerve root was ultimately mobilized so that the common dural tube could be mobilized medially and allow for performance of a discectomy at both L3-4 and L4-5.  At L3-4 the disc was noted to be soft fluctuant in the disc space distracted easily. The disc was evacuated of all disc material possible and the interspace was then sized for a 12 lordotic spacer measuring 10 mm in height 28 mm in length. The spacers were packed with autograft and the interspace was packed with a total of 9 mL of autograft with the spacers placed in position. At this point attention was turned to L4-L5 or the disc space was very tight the interspace was evacuated and gradually the SLAP for some mobilization of the disc space such that an 8 mm tall trial spacer could be placed. Here ultimately an 8 mm spacer with a degrees of lordosis was able to be placed along with 6 mL of autograft into the interspace. Once the grafting procedure was completed the lateral gutters were decorticated and packed away 10 mL allograft in a resorbable back was packed into the lateral gutters on each side. Pedicle screws were then chosen and placed at L4-L5 and L3 these were 6.5 x 45 mm screws. 60 mm precontoured rods were then placed between the screw heads and tightened in a neutral construct. The surgical site was then  inspected carefully to make sure that the paths of the L3 the L4 and L5 nerve roots were well decompressed hemostasis and the soft tissues was obtained meticulously and then the lumbar dorsal fascia was  reapproximated with #1 Vicryl in interrupted fashion 2-0 Vicryl was used in the subcutaneous anus tissues and 3-0 Vicryl to close subcuticular skin. Blood loss was estimated at 300 mL. No Cell Saver blood was returned.

## 2016-03-07 NOTE — Progress Notes (Signed)
MD requested to notify anesthesiologist about patient scratching left eye after surgery. Nurse spoke with Judie Petitharlene Edwards, MD and she stated she will come see patient.

## 2016-03-08 MED ORDER — KETOROLAC TROMETHAMINE 0.5 % OP SOLN
1.0000 [drp] | Freq: Three times a day (TID) | OPHTHALMIC | Status: AC | PRN
  Administered 2016-03-08: 1 [drp] via OPHTHALMIC
  Filled 2016-03-08: qty 3

## 2016-03-08 MED ORDER — HYDROCODONE-ACETAMINOPHEN 5-325 MG PO TABS
1.0000 | ORAL_TABLET | ORAL | Status: DC | PRN
  Administered 2016-03-08: 1 via ORAL
  Filled 2016-03-08: qty 1

## 2016-03-08 MED ORDER — KETOROLAC TROMETHAMINE 0.5 % OP SOLN
1.0000 [drp] | Freq: Four times a day (QID) | OPHTHALMIC | Status: DC
  Administered 2016-03-08 (×2): 1 [drp] via OPHTHALMIC
  Filled 2016-03-08: qty 3

## 2016-03-08 MED ORDER — BSS IO SOLN
15.0000 mL | Freq: Once | INTRAOCULAR | Status: AC
  Administered 2016-03-08: 15 mL via INTRAOCULAR
  Filled 2016-03-08: qty 15

## 2016-03-08 NOTE — Evaluation (Signed)
Occupational Therapy Evaluation Patient Details Name: Sandy NeedleJoan C Bow MRN: [id number] DOB: 12/30/1940 Today's Date: 03/08/2016    History of Present Illness pt is a 75 y/o female with h/o HTN, DJD, admitted with back and bilateral leg pain due to a degenerative spondylolisthesis, s/p L3 and 4 laminectomies and PLIF at L34 and L45.   Clinical Impression   Pt reports she was independent with ADL PTA. Currently pt overall min guard for functional mobility, max assist to don brace and mod assist for LB ADL. Began back, safety, and ADL education with pt and husband. Pt planning to d/c home with 24/7 supervision initially from family. Pt would benefit from continued skilled OT to address established goals.    Follow Up Recommendations  No OT follow up;Supervision/Assistance - 24 hour (initially)    Equipment Recommendations  None recommended by OT    Recommendations for Other Services       Precautions / Restrictions Precautions Precautions: Back;Fall Precaution Booklet Issued: Yes (comment) Precaution Comments: Pt able to recall 2/3 back precautions. Reviewed all precautions with pt and husband.  Required Braces or Orthoses: Spinal Brace Spinal Brace: Applied in sitting position Restrictions Weight Bearing Restrictions: No      Mobility Bed Mobility               General bed mobility comments: Pt sitting EOB upon arrival  Transfers Overall transfer level: Needs assistance Equipment used: Rolling walker (2 wheeled) Transfers: Sit to/from Stand Sit to Stand: Min guard         General transfer comment: Cues for hand placement and technique.    Balance Overall balance assessment: Needs assistance Sitting-balance support: Feet supported;No upper extremity supported Sitting balance-Leahy Scale: Good     Standing balance support: No upper extremity supported;During functional activity Standing balance-Leahy Scale: Fair Standing balance comment: Able to stand at  sink and wash hands without UE support                            ADL Overall ADL's : Needs assistance/impaired Eating/Feeding: Set up;Sitting   Grooming: Supervision/safety;Standing;Wash/dry hands Grooming Details (indicate cue type and reason): Educated on use of 2 cups for oral care Upper Body Bathing: Set up;Supervision/ safety;Sitting   Lower Body Bathing: Moderate assistance;Sit to/from stand   Upper Body Dressing : Maximal assistance;Sitting Upper Body Dressing Details (indicate cue type and reason): to don back brace Lower Body Dressing: Moderate assistance;Sit to/from stand   Toilet Transfer: Min guard;Ambulation;Regular Toilet;Grab bars;RW   Toileting- Clothing Manipulation and Hygiene: Minimal assistance;Sit to/from stand Toileting - Clothing Manipulation Details (indicate cue type and reason): Pt able to perform peri care with lateral lean. Assist for underwear.     Functional mobility during ADLs: Min guard;Rolling walker General ADL Comments: Educated pt on maintaining back precautions during functional activities, keeping frequently used items at counter top height, back brace wear schedule.     Vision Additional Comments: Per pt; when she woke up from surgery it felt like there was gravel in her L eye. Pt with eye patch in room and reports she can see better now. Able to complete functional tasks without difficulty.   Perception     Praxis      Pertinent Vitals/Pain Pain Assessment: Faces Faces Pain Scale: Hurts little more Pain Location: back Pain Descriptors / Indicators: Sore Pain Intervention(s): Monitored during session;Repositioned     Hand Dominance     Extremity/Trunk Assessment Upper Extremity Assessment  Upper Extremity Assessment: Overall WFL for tasks assessed   Lower Extremity Assessment Lower Extremity Assessment: Defer to PT evaluation   Cervical / Trunk Assessment Cervical / Trunk Assessment: Other exceptions Cervical /  Trunk Exceptions: s/p spinal sx   Communication Communication Communication: No difficulties   Cognition Arousal/Alertness: Awake/alert Behavior During Therapy: WFL for tasks assessed/performed Overall Cognitive Status: Within Functional Limits for tasks assessed                     General Comments       Exercises       Shoulder Instructions      Home Living Family/patient expects to be discharged to:: Private residence Living Arrangements: Spouse/significant other Available Help at Discharge: Family;Available 24 hours/day (initially) Type of Home: House Home Access: Stairs to enter [employer] of Steps: 2   Home Layout: One level     Bathroom Shower/Tub: Producer, television/film/video:  (one of each)     Home Equipment: Walker - 2 wheels;Wheelchair - [employer];Shower seat - built in;Grab bars - tub/shower          Prior Functioning/Environment Level of Independence: Independent                 OT Problem List: Impaired balance (sitting and/or standing);Decreased knowledge of precautions;Decreased knowledge of use of DME or AE;Pain   OT Treatment/Interventions: Self-care/ADL training;Energy conservation;DME and/or AE instruction;Therapeutic activities;Patient/family education;Balance training    OT Goals(Current goals can be found in the care plan section) Acute Rehab OT Goals Patient Stated Goal: back home able to do what I want to. OT Goal Formulation: With patient/family Time For Goal Achievement: 03/22/16 Potential to Achieve Goals: Good ADL Goals Pt Will Perform Tub/Shower Transfer: Shower transfer;with supervision;shower seat;rolling walker;ambulating Additional ADL Goal #1: Pt will perform bed mobility with HOB flat and no use of bed rails with supervision for safety and good log roll technique. Additional ADL Goal #2: Pt will independently verbally recall 3/3 back precautions and maintain throughout  ADL. Additional ADL Goal #3: Pt will don/doff back brace with set up as precursor for ADL and functional mobility.  OT Frequency: Min 2X/week   Barriers to D/C:            Co-evaluation              End of Session Equipment Utilized During Treatment: Back brace;Rolling walker Nurse Communication: Mobility status  Activity Tolerance: Patient tolerated treatment well Patient left: in chair;with call bell/phone within reach;with nursing/sitter in room;with family/visitor present   Time: 1610-9604 OT Time Calculation (min): 20 min Charges:  OT General Charges $OT Visit: 1 Procedure OT Evaluation $OT Eval Moderate Complexity: 1 Procedure G-Codes:     Gaye Alken M.S., OTR/L Pager: [phone]  03/08/2016, 5:17 PM

## 2016-03-08 NOTE — Progress Notes (Signed)
Patient ID: Sandy Harper, female   DOB: 10/29/1940, 75 y.o.   MRN: [id number] Vital signs stable Motor function appears intact in lower extremities Pain under moderate control Having some significant back pain with mobilization Left eye still bothering her Anesthesia apparently has seen her

## 2016-03-08 NOTE — Care Management Note (Signed)
Case Management Note  Patient Details  Name: Idalia NeedleJoan C Luz MRN: [id number] Date of Birth: 06/02/1940  Subjective/Objective:   Pt underwent:  Lumbar three-four, Lumbar four-five Posterior lumbar interbody fusion. She is from home with her spouse.                Action/Plan: Awaiting PT/OT recommendations. CM following for d/c needs.   Expected Discharge Date:   (Pending)               Expected Discharge Plan:     In-House Referral:     Discharge planning Services     Post Acute Care Choice:    Choice offered to:     DME Arranged:    DME Agency:     HH Arranged:    HH Agency:     Status of Service:  In process, will continue to follow  If discussed at Long Length of Stay Meetings, dates discussed:    Additional Comments:  Kermit BaloKelli F Crystalmarie Yasin, RN 03/08/2016, 10:19 AM

## 2016-03-08 NOTE — Consult Note (Signed)
[facility] CM Primary Care Navigator  03/08/2016  Sandy Harper [birth year] [id number]  Patient seen at the bedside to identify discharge needs. She appears to have flat affect. Met [name] as well. Patient's husband shared that patient complained of constant back pain not relieved by pain medicines and decreased mobility/ ambulation that led to this admission/ surgery.  Patient confirms that primary care provider is Dr. Loura Pardon with [facility]. Patient is able to drive by herself prior to admission. Transportation to doctors' appointments can be provided by husband when discharged.  Patient's husband states using [name]/ pharmacy to obtain medications without difficulty. She does her medication management at home straight out of the containers as stated. She is from home and lives with spouse.  Husband will be the primary caregiver at home according to him.  Patient and husband had expressed understanding to call primary care provider's office for a post discharge follow-up appointment within a week or sooner if needed once discharged home. Patient letter provided as a reminder.  Patient denies any further needs or concerns at this time.   For questions, please contact:  Edwena Felty A. Shalimar Mcclain, BSN, RN-BC [facility] PRIMARY CARE Navigator Cell: [phone]

## 2016-03-08 NOTE — Evaluation (Signed)
Physical Therapy Evaluation Patient Details Name: Sandy Harper MRN: [id number] DOB: Jan 21, 1941 Today's Date: 03/08/2016   History of Present Illness  pt is a 75 y/o female with h/o HTN, DJD, admitted with back and bilateral leg pain due to a degenerative spondylolisthesis, s/p L3 and 4 laminectomies and PLIF at L34 and L45.  Clinical Impression  Pt admitted with/for lumbar fusion sugery.  Pt currently limited functionally due to the problems listed below.  (see problems list.)  Pt will benefit from PT to maximize function and safety to be able to get home safely with available assist of familty  Pt is at min assist level for mobility presently. .     Follow Up Recommendations No PT follow up    Equipment Recommendations  None recommended by PT    Recommendations for Other Services       Precautions / Restrictions Precautions Precautions: Back;Fall Required Braces or Orthoses: Spinal Brace Spinal Brace: Applied in sitting position      Mobility  Bed Mobility Overal bed mobility: Needs Assistance Bed Mobility: Rolling;Sidelying to Sit Rolling: Mod assist Sidelying to sit: Min assist       General bed mobility comments: cues for technique and assist to roll to EOB and come up off R elbow.  Transfers Overall transfer level: Needs assistance   Transfers: Sit to/from Stand Sit to Stand: Min assist            Ambulation/Gait Ambulation/Gait assistance: Min guard Ambulation Distance (Feet): 180 Feet Assistive device: Rolling walker (2 wheeled) Gait Pattern/deviations: Step-through pattern Gait velocity: slower Gait velocity interpretation: Below normal speed for age/gender General Gait Details: generally steady, but guarded.  Stairs            Wheelchair Mobility    Modified Rankin (Stroke Patients Only)       Balance Overall balance assessment: Needs assistance   Sitting balance-Leahy Scale: Good     Standing balance support: No upper extremity  supported;Bilateral upper extremity supported Standing balance-Leahy Scale: Fair                               Pertinent Vitals/Pain Pain Assessment: Faces Faces Pain Scale: Hurts even more Pain Location: back Pain Descriptors / Indicators: Aching;Sore Pain Intervention(s): Monitored during session;Repositioned    Home Living Family/patient expects to be discharged to:: Private residence Living Arrangements: Spouse/significant other Available Help at Discharge: Family;Available 24 hours/day Type of Home: House Home Access: Stairs to enter   [employer] of Steps: 2 Home Layout: One level Home Equipment: Walker - 2 wheels;Wheelchair - manual      Prior Function Level of Independence: Independent               Hand Dominance        Extremity/Trunk Assessment               Lower Extremity Assessment: Overall WFL for tasks assessed;Generalized weakness         Communication   Communication: No difficulties  Cognition Arousal/Alertness: Lethargic;Awake/alert Behavior During Therapy: WFL for tasks assessed/performed Overall Cognitive Status: Within Functional Limits for tasks assessed                      General Comments General comments (skin integrity, edema, etc.): pt instructed in back care/prec, logroll, lifting restrictions and progression of activity.    Exercises     Assessment/Plan    PT Assessment  Patient needs continued PT services  PT Problem List Decreased strength;Decreased activity tolerance;Decreased balance;Decreased mobility;Decreased knowledge of use of DME;Decreased knowledge of precautions;Pain          PT Treatment Interventions DME instruction;Gait training;Stair training;Functional mobility training;Therapeutic activities;Balance training;Patient/family education    PT Goals (Current goals can be found in the Care Plan section)  Acute Rehab PT Goals Patient Stated Goal: back home able to do  what I want to. PT Goal Formulation: With patient Time For Goal Achievement: 03/15/16    Frequency Min 5X/week   Barriers to discharge        Co-evaluation               End of Session Equipment Utilized During Treatment: Back brace Activity Tolerance: Patient tolerated treatment well;Patient limited by pain Patient left: in bed;with call bell/phone within reach Nurse Communication: Mobility status         Time: 1029-1101 PT Time Calculation (min) (ACUTE ONLY): 32 min   Charges:   PT Evaluation $PT Eval Low Complexity: 1 Procedure PT Treatments $Gait Training: 8-22 mins   PT G Codes:        Shaila Gilchrest, Eliseo GumKenneth V 03/08/2016, 12:36 PM 03/08/2016  Pearson BingKen Shanine Kreiger, PT [phone]  (pager)

## 2016-03-08 NOTE — Clinical Social Work Note (Signed)
CSW consulted for New SNF. PT is not recommending any follow up. Updated RNCM. CSW is signing off as no further needs identified.   Dede QuerySarah Latrenda Irani, MSW, LCSW Clinical Social Worker  [phone]

## 2016-03-09 LAB — GLUCOSE, CAPILLARY: Glucose-Capillary: 184 mg/dL — ABNORMAL HIGH (ref 65–99)

## 2016-03-09 MED FILL — Medication: Qty: 1 | Status: AC

## 2016-03-10 ENCOUNTER — Other Ambulatory Visit: Payer: Self-pay | Admitting: Neurological Surgery

## 2016-03-13 LAB — MISC LABCORP TEST (SEND OUT): LABCORP TEST CODE: 9985

## 2016-03-14 LAB — CULTURE, BLOOD (ROUTINE X 2): CULTURE: NO GROWTH

## 2016-03-15 ENCOUNTER — Telehealth: Payer: Self-pay | Admitting: Neurological Surgery

## 2016-03-29 DIAGNOSIS — 419620001 Death: Secondary | SNOMED CT | POA: Diagnosis not present

## 2016-03-29 LAB — MISC LABCORP TEST (SEND OUT): LABCORP TEST CODE: 284410

## 2016-03-29 NOTE — Progress Notes (Signed)
Patient transported to hospital morgue at this time.

## 2016-03-29 NOTE — Progress Notes (Signed)
Rounded on patient around 0325 noted she was unresponsive started code blue CPR and crash cart was brought in code team arrived within minutes, code was called 0354 10/12  husband and [facility] were notified Dr Yetta BarreJones arrived after code was called, husband arrived shortly after that and I did my best to console and inform him. Will attend to the post mortuum process.

## 2016-03-29 NOTE — Code Documentation (Signed)
  Patient Name: Sandy Harper   MRN: [id number]   Date of Birth/ Sex: 07/20/1940 , female      Admission Date: 11-16-2015  Attending Provider: Barnett AbuHenry Elsner, MD  Primary Diagnosis: Spondylolisthesis of lumbar region   Indication: Pt was in her usual state of health until this PM, when she was noted to be unresponsive. Code blue was subsequently called. At the time of arrival on scene, ACLS protocol was underway.  The patient was found unresponsive and pulseless slumped in the bed and code blue was called. Defibrillator leads were placed and she was noted to be in asystole. CPR was continued and intubation performed. Given lack of response and an unknown duration of her unresponsiveness prior to the activation efforts were discontinued after 20 minutes of resuscitation with sustained asystole.   Technical Description:  - CPR performance duration:  20 minutes  - Was defibrillation or cardioversion used? No   - Was external pacer placed? No  - Was patient intubated pre/post CPR? Yes   Medications Administered: Y = Yes; Blank = No Amiodarone    Atropine    Calcium    Epinephrine  Y  Lidocaine    Magnesium    Norepinephrine    Phenylephrine    Sodium bicarbonate  Y  Vasopressin    Other    Post CPR evaluation:  - Final Status - Was patient successfully resuscitated ? No   Miscellaneous Information:   - Time of death:  [id number]  - Primary team notified?  Yes  - Family Notified? Yes      Fuller Planhristopher W Neeraj Housand, MD PGY-II Internal Medicine Resident Pager# [phone] 02/27/2016, 4:10 AM

## 2016-03-29 NOTE — Progress Notes (Signed)
Chaplain responded to page to come to 5C04. At nurses station I learned that pt had passed and husband was in [facility] waiting room. Husband was distraught that pt had died unexpectedly and also that his children weren't answering their phones. I attempted to provide emotional support and empathic conversation. Pt's husband felt guilty for having left at around 1:00 am and not having been here when she coded. He kept saying pt was healthy other than being here for back surgery and he couldn't believe she had died. After a few minutes pt's pastor and stepson arrived. Pt's husband was having difficulty explaining to them what had happened so I brought in RN Georga HackingStephen Morton to brief them. RN described having found pt unresponsive and immediately activating Code Blue. As pt's husband pressed for an explanation RN stated cause of death is unknown but there must have been a massive event, possibly an embolism. Pt's husband stated he wanted everything possible  done to determine what happened. I gave pt's husband privacy to be with his pastor. In due time the pastor asked me whether pt's husband was now free to leave. I brought a nurse in who told him he would need to sign an autopsy consent form. She also told him the autopsy expense would be his responsibility and he agreed.

## 2016-03-29 NOTE — Discharge Summary (Signed)
Death summary: 03/13/2016 Date of admission: 05/20/16 Date of death: 02/28/2016: 4 AM Admitting diagnosis: Lumbar spondylolisthesis with radiculopathy, neurogenic claudication, L3-L4 L4-L5. Status post arthrodesis L1 to L2-3. Cause of death: Unknown at this time Hospital course: The [facility]16 to undergo elective surgical decompression arthrodesis from L3-L5. Patient had previous surgical decompression and fusion at L2-3 and subsequently at L1-L2. She's had problems with recurring and chronic back pain with leg pain that had become increasingly unresponsive to epidural injections and conservative management.  Surgery was undertaken to decompress and stabilize L3-L5 and was uncomplicated. Blood loss was typical for the procedure and no unusual circumstances weren't encountered during the surgery. She appeared to be making a typical recovery with typical postoperative back pain. She did have some concern for corneal abrasion on the left side which was being treated with an eye patch and some eyedrops. She received small amounts of pain medication with less pain medication approximately 7 PM consisting of hydrocodone 10 mg with Tylenol. She received a typical sleeping medication in approximately 10:50 PM which consisted of Ambien 10 mg. She was found unresponsive approximately 3:30 in the morning a CODE BLUE process ensued for 20 minutes at which point she was pronounced dead. Autopsy is pending.

## 2016-03-29 NOTE — Progress Notes (Signed)
Patient ID: Sandy Harper, female   DOB: 03/12/1941, 75 y.o.   MRN: [id number] I was called because pt "coded." Pt was found in asystole and a code was called. Unknown how long she had been in asystole. She was intubated and a code was conducted for 20 minutes and pt was pronounced dead. Apparently the husband is on the way. COD unclear - PE, MI, meds?

## 2016-03-29 NOTE — Progress Notes (Signed)
Family requested to remove patient's belongings from room and nurse witnessed.

## 2016-03-29 DEATH — deceased

## 2016-06-12 ENCOUNTER — Telehealth: Payer: Self-pay | Admitting: Family Medicine

## 2016-06-12 NOTE — Telephone Encounter (Signed)
Patient's daughter,Tisha,called.  She wanted to let Dr.Tower know patient passed away at the hospital on 03/09/17.

## 2017-01-05 IMAGING — DX DG FOOT COMPLETE 3+V*L*
3 series · 3 of 3 positions shown · non-contrast
Comparison: 07/04/2013

CLINICAL DATA: Left foot pain. Tender fourth metatarsal. Rule out
fracture.

EXAM:
LEFT FOOT - COMPLETE 3+ VIEW

[foot ap]
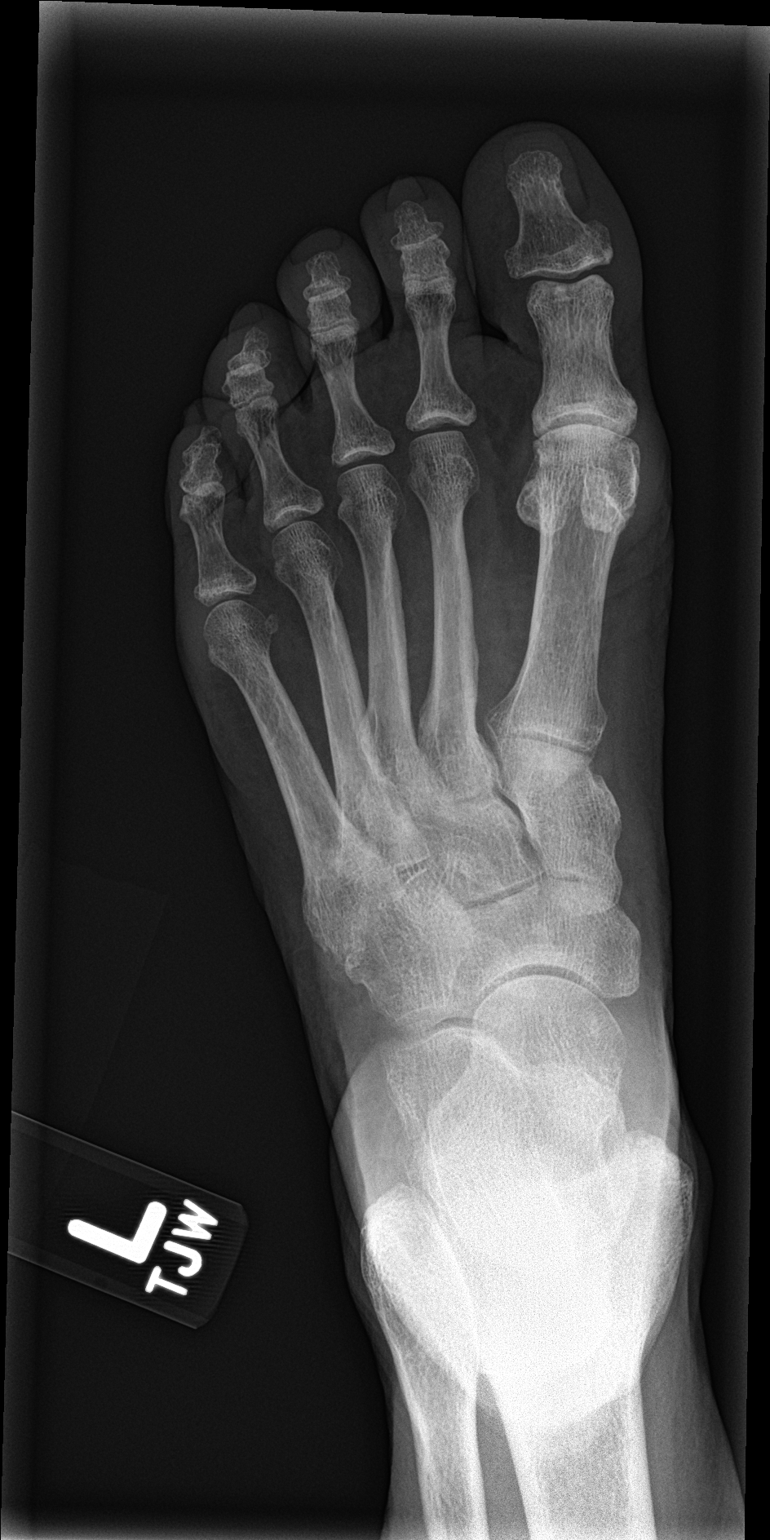

[foot obl]
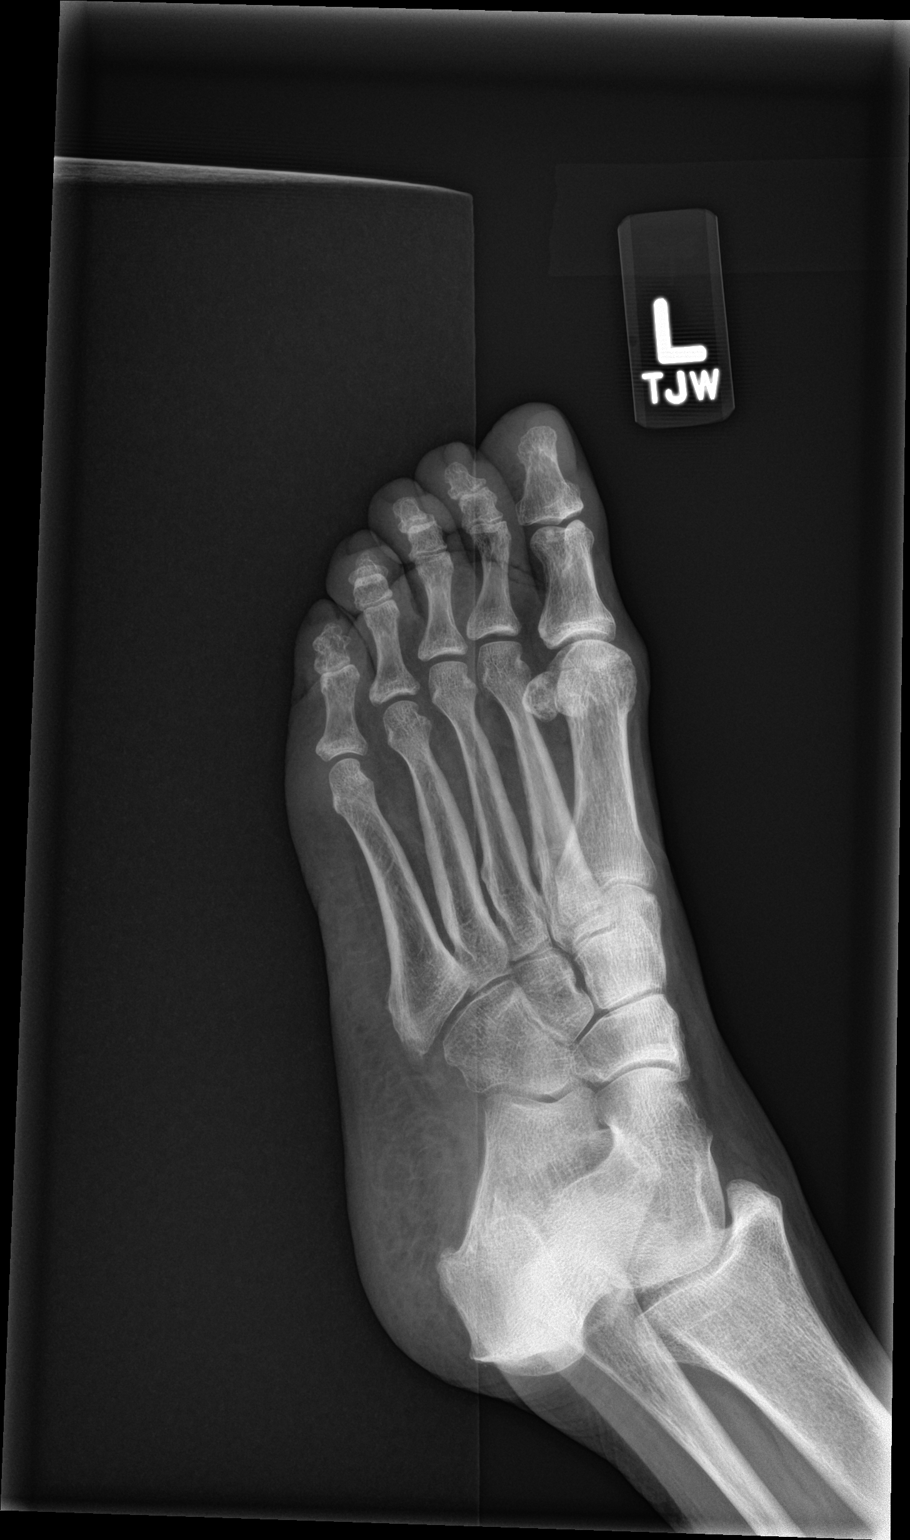

[foot lat]
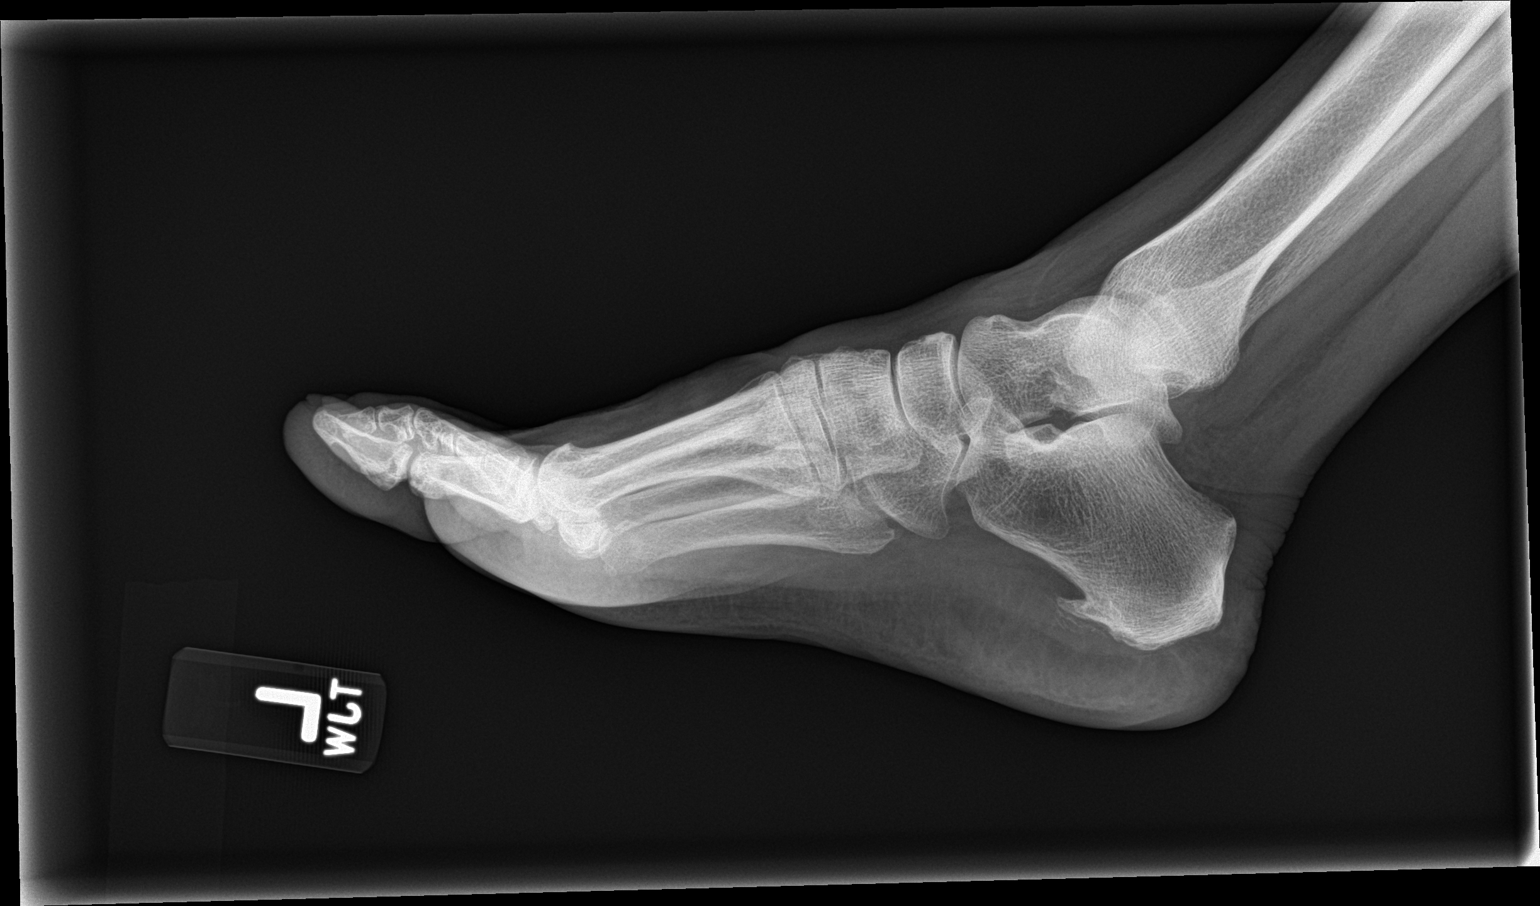

[3 of 3 positions shown; findings below may reference images not displayed]

FINDINGS: There is no evidence of fracture or dislocation. There is no
evidence of arthropathy or other focal bone abnormality. Soft
tissues are unremarkable.
IMPRESSION: Negative.

## 2017-08-07 IMAGING — CR DG LUMBAR SPINE 1V
1 series · 1 of 1 positions shown · non-contrast
Comparison: 02/17/2016

CLINICAL DATA: Elective surgery

EXAM:
LUMBAR SPINE - 1 VIEW

[lateral]
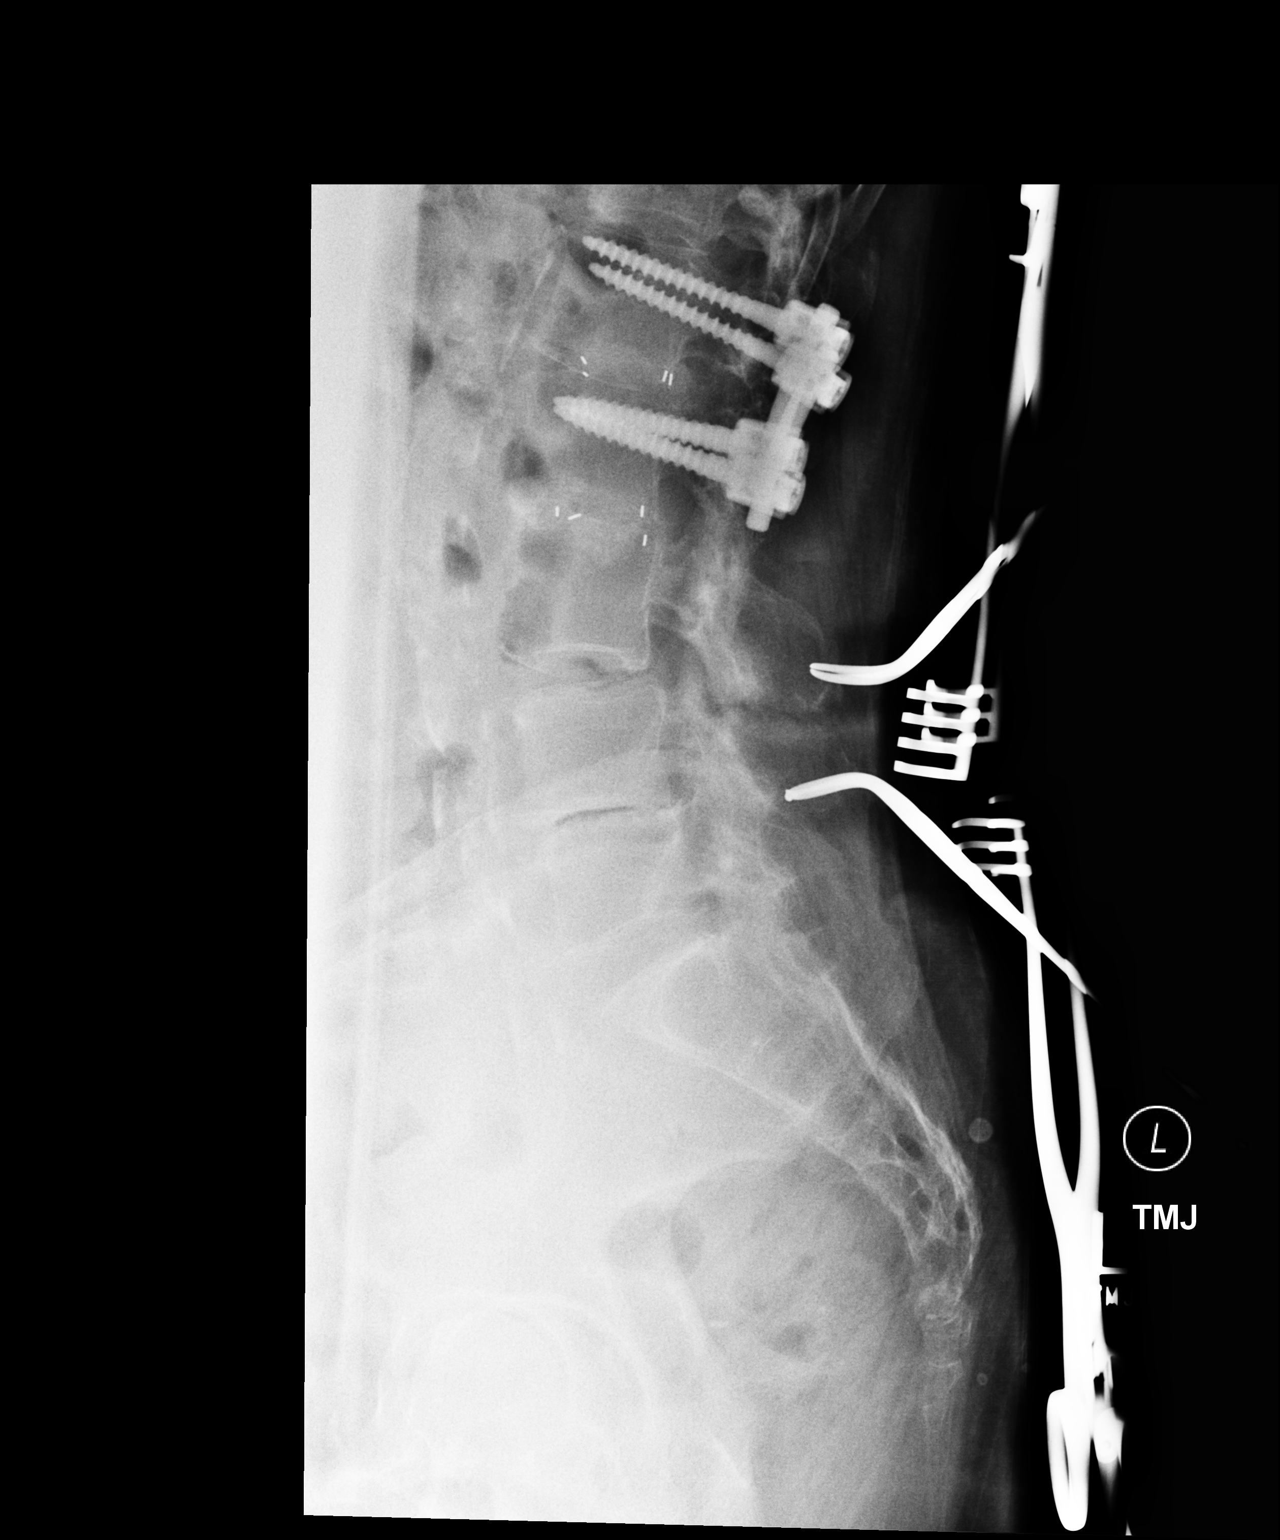

[1 of 1 positions shown; findings below may reference images not displayed]

FINDINGS: Cross-table lateral view of the lumbar spine demonstrates remote
changes of posterior fusion at L1-2. Posterior surgical instruments
are noted along the spinous processes of L3 and L4.
IMPRESSION: Intraoperative localization as above.

## 2017-08-07 IMAGING — RF DG LUMBAR SPINE 2-3V
1 series · 2 of 2 positions shown · non-contrast
Comparison: None

FLUOROSCOPY TIME:  36 seconds

CLINICAL DATA: L3-4, L4-5 PLIF

EXAM:
DG C-ARM 61-120 MIN; LUMBAR SPINE - 2-3 VIEW

[Series 1: run · 2 of 2 slices shown]
[im 1/2]
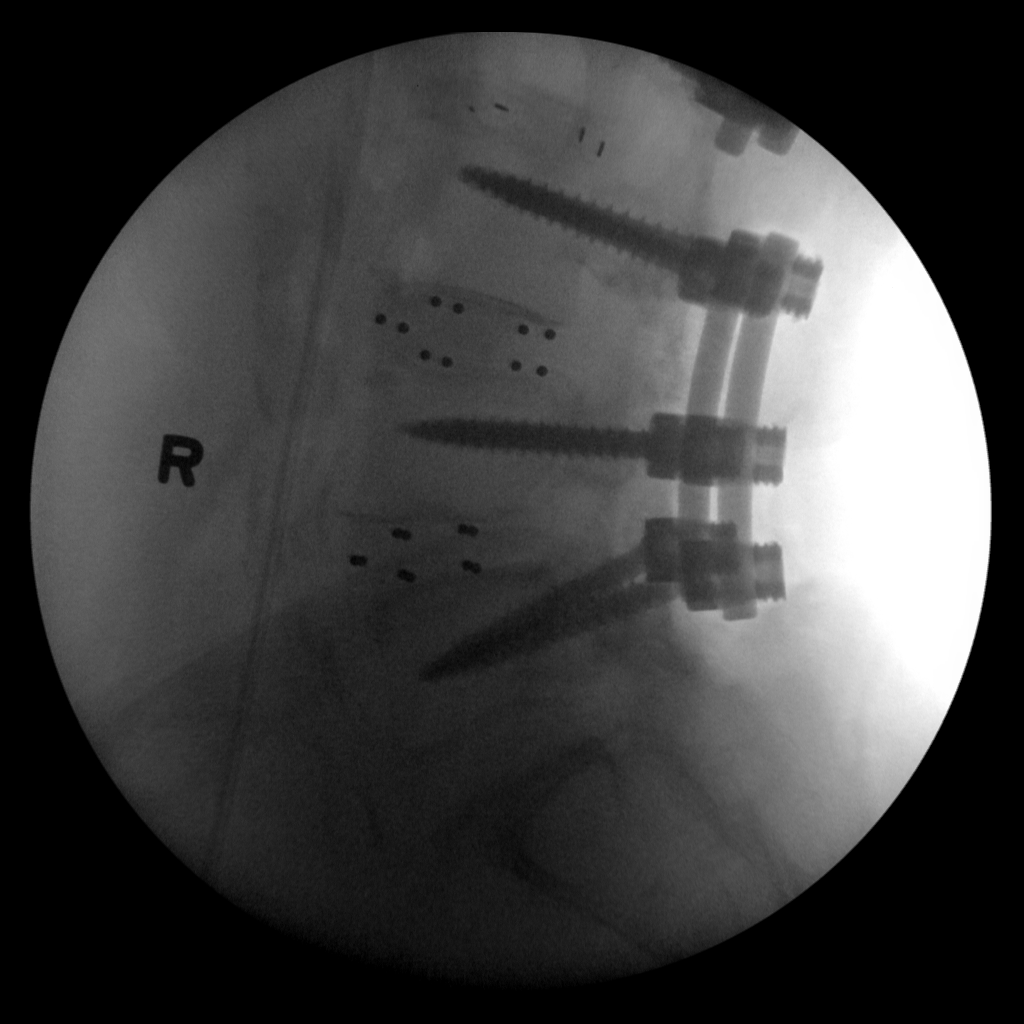
[im 2/2]
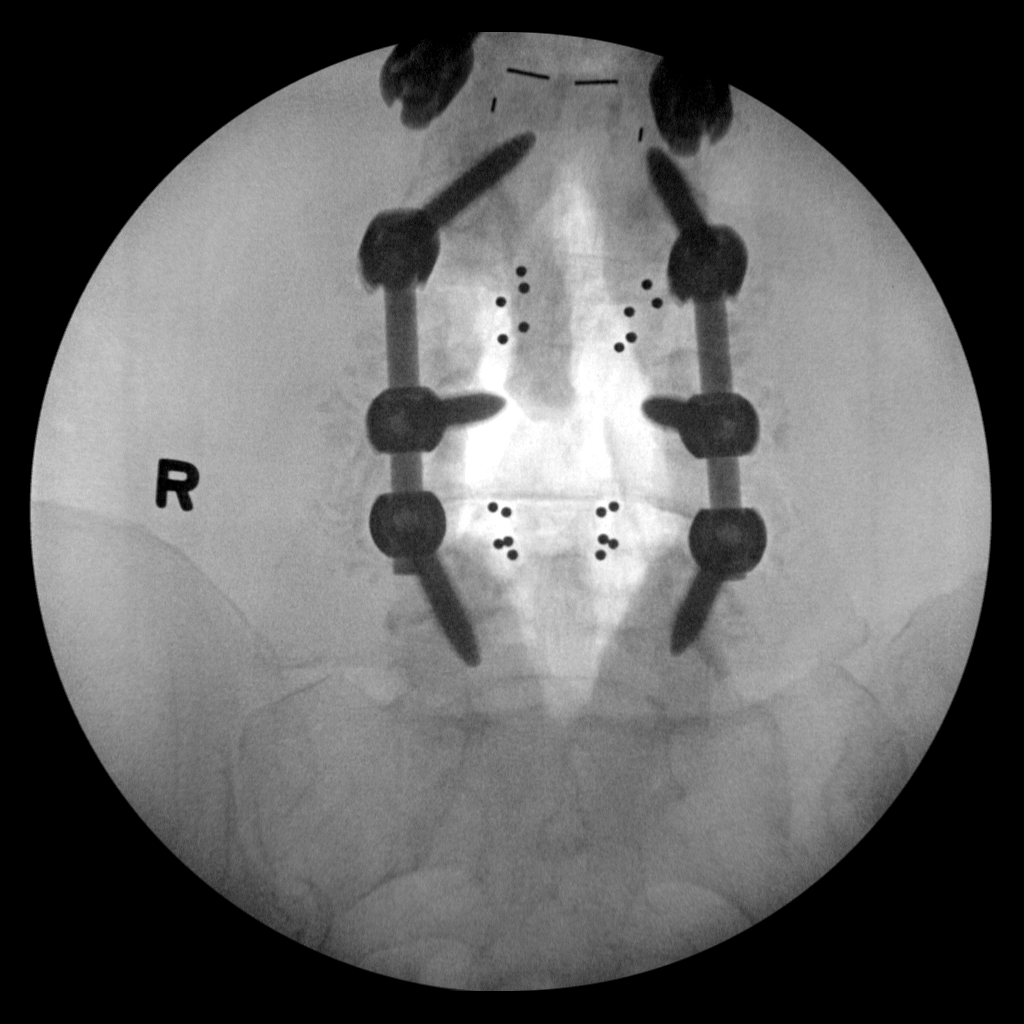

[2 of 2 positions shown; findings below may reference images not displayed]

FINDINGS: Posterior lumbar interbody at L3-4 and L4-5 without failure
complication. Bilateral pedicle screws at each level. Partially
visualized is L1-2 posterior lumbar interbody fusion previously
performed.
IMPRESSION: Posterior lumbar interbody fusion at L3-4 and L4-5.
